# Patient Record
Sex: Male | Born: 1985 | Race: Black or African American | Hispanic: No | Marital: Single | Smoking: Current every day smoker
Health system: Southern US, Community
[De-identification: ages and names within clinical notes are randomized; demographics above are authoritative.]

## PROBLEM LIST (undated history)

## (undated) DIAGNOSIS — J45909 Unspecified asthma, uncomplicated: Secondary | ICD-10-CM

## (undated) DIAGNOSIS — M797 Fibromyalgia: Secondary | ICD-10-CM

## (undated) DIAGNOSIS — D219 Benign neoplasm of connective and other soft tissue, unspecified: Secondary | ICD-10-CM

## (undated) HISTORY — PX: EYE SURGERY: SHX253

---
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
## 2006-02-05 ENCOUNTER — Emergency Department (HOSPITAL_COMMUNITY): Admission: EM | Admit: 2006-02-05 | Discharge: 2006-02-05 | Payer: Self-pay | Admitting: Emergency Medicine

## 2006-03-16 ENCOUNTER — Emergency Department (HOSPITAL_COMMUNITY): Admission: EM | Admit: 2006-03-16 | Discharge: 2006-03-17 | Payer: Self-pay | Admitting: Emergency Medicine

## 2006-09-03 ENCOUNTER — Emergency Department (HOSPITAL_COMMUNITY): Admission: EM | Admit: 2006-09-03 | Discharge: 2006-09-03 | Payer: Self-pay | Admitting: Emergency Medicine

## 2006-11-29 ENCOUNTER — Emergency Department (HOSPITAL_COMMUNITY): Admission: EM | Admit: 2006-11-29 | Discharge: 2006-11-29 | Payer: Self-pay | Admitting: Emergency Medicine

## 2006-12-01 ENCOUNTER — Ambulatory Visit: Payer: Self-pay | Admitting: Infectious Diseases

## 2006-12-01 ENCOUNTER — Inpatient Hospital Stay (HOSPITAL_COMMUNITY): Admission: EM | Admit: 2006-12-01 | Discharge: 2006-12-06 | Payer: Self-pay | Admitting: Emergency Medicine

## 2006-12-06 ENCOUNTER — Encounter (INDEPENDENT_AMBULATORY_CARE_PROVIDER_SITE_OTHER): Payer: Self-pay | Admitting: Internal Medicine

## 2006-12-06 DIAGNOSIS — M6282 Rhabdomyolysis: Secondary | ICD-10-CM

## 2006-12-08 ENCOUNTER — Inpatient Hospital Stay (HOSPITAL_COMMUNITY): Admission: EM | Admit: 2006-12-08 | Discharge: 2006-12-10 | Payer: Self-pay | Admitting: Emergency Medicine

## 2006-12-13 ENCOUNTER — Encounter (INDEPENDENT_AMBULATORY_CARE_PROVIDER_SITE_OTHER): Payer: Self-pay | Admitting: Internal Medicine

## 2006-12-16 ENCOUNTER — Ambulatory Visit: Payer: Self-pay | Admitting: Internal Medicine

## 2006-12-16 ENCOUNTER — Encounter (INDEPENDENT_AMBULATORY_CARE_PROVIDER_SITE_OTHER): Payer: Self-pay | Admitting: Internal Medicine

## 2006-12-17 LAB — CONVERTED CEMR LAB
BUN: 10 mg/dL (ref 6–23)
CO2: 27 meq/L (ref 19–32)
Chloride: 109 meq/L (ref 96–112)
Creatinine, Ser: 0.74 mg/dL (ref 0.40–1.50)
Glucose, Bld: 91 mg/dL (ref 70–99)
Sodium: 144 meq/L (ref 135–145)
Total CK: 1880 units/L — ABNORMAL HIGH (ref 7–232)

## 2007-01-15 ENCOUNTER — Emergency Department (HOSPITAL_COMMUNITY): Admission: EM | Admit: 2007-01-15 | Discharge: 2007-01-15 | Payer: Self-pay | Admitting: Emergency Medicine

## 2007-04-01 ENCOUNTER — Emergency Department (HOSPITAL_COMMUNITY): Admission: EM | Admit: 2007-04-01 | Discharge: 2007-04-01 | Payer: Self-pay | Admitting: Family Medicine

## 2007-04-18 ENCOUNTER — Emergency Department (HOSPITAL_COMMUNITY): Admission: EM | Admit: 2007-04-18 | Discharge: 2007-04-18 | Payer: Self-pay | Admitting: Emergency Medicine

## 2007-04-20 ENCOUNTER — Emergency Department (HOSPITAL_COMMUNITY): Admission: EM | Admit: 2007-04-20 | Discharge: 2007-04-20 | Payer: Self-pay | Admitting: Family Medicine

## 2007-12-02 ENCOUNTER — Inpatient Hospital Stay (HOSPITAL_COMMUNITY): Admission: EM | Admit: 2007-12-02 | Discharge: 2007-12-10 | Payer: Self-pay | Admitting: Emergency Medicine

## 2007-12-12 ENCOUNTER — Emergency Department (HOSPITAL_COMMUNITY): Admission: EM | Admit: 2007-12-12 | Discharge: 2007-12-12 | Payer: Self-pay | Admitting: Emergency Medicine

## 2007-12-13 ENCOUNTER — Other Ambulatory Visit: Payer: Self-pay | Admitting: Emergency Medicine

## 2007-12-13 ENCOUNTER — Emergency Department (HOSPITAL_COMMUNITY): Admission: EM | Admit: 2007-12-13 | Discharge: 2007-12-13 | Payer: Self-pay | Admitting: Emergency Medicine

## 2007-12-14 ENCOUNTER — Inpatient Hospital Stay (HOSPITAL_COMMUNITY): Admission: EM | Admit: 2007-12-14 | Discharge: 2007-12-14 | Payer: Self-pay | Admitting: Urology

## 2007-12-16 ENCOUNTER — Emergency Department (HOSPITAL_COMMUNITY): Admission: EM | Admit: 2007-12-16 | Discharge: 2007-12-17 | Payer: Self-pay | Admitting: Emergency Medicine

## 2007-12-16 ENCOUNTER — Emergency Department (HOSPITAL_COMMUNITY): Admission: EM | Admit: 2007-12-16 | Discharge: 2007-12-16 | Payer: Self-pay | Admitting: General Surgery

## 2008-12-10 ENCOUNTER — Emergency Department (HOSPITAL_COMMUNITY): Admission: EM | Admit: 2008-12-10 | Discharge: 2008-12-10 | Payer: Self-pay | Admitting: Emergency Medicine

## 2009-01-28 ENCOUNTER — Observation Stay (HOSPITAL_COMMUNITY): Admission: EM | Admit: 2009-01-28 | Discharge: 2009-02-01 | Payer: Self-pay | Admitting: Emergency Medicine

## 2009-01-30 ENCOUNTER — Ambulatory Visit: Payer: Self-pay | Admitting: Infectious Diseases

## 2009-01-30 ENCOUNTER — Encounter (INDEPENDENT_AMBULATORY_CARE_PROVIDER_SITE_OTHER): Payer: Self-pay | Admitting: Oral Surgery

## 2009-08-09 ENCOUNTER — Emergency Department (HOSPITAL_COMMUNITY): Admission: EM | Admit: 2009-08-09 | Discharge: 2009-08-10 | Payer: Self-pay | Admitting: Emergency Medicine

## 2009-08-21 ENCOUNTER — Emergency Department (HOSPITAL_COMMUNITY): Admission: EM | Admit: 2009-08-21 | Discharge: 2009-08-21 | Payer: Self-pay | Admitting: Emergency Medicine

## 2009-11-23 ENCOUNTER — Inpatient Hospital Stay (HOSPITAL_COMMUNITY): Admission: EM | Admit: 2009-11-23 | Discharge: 2009-11-27 | Payer: Self-pay | Admitting: Emergency Medicine

## 2009-12-09 ENCOUNTER — Ambulatory Visit: Payer: Self-pay | Admitting: Family Medicine

## 2009-12-09 ENCOUNTER — Encounter (INDEPENDENT_AMBULATORY_CARE_PROVIDER_SITE_OTHER): Payer: Self-pay | Admitting: Internal Medicine

## 2010-04-22 ENCOUNTER — Emergency Department (HOSPITAL_COMMUNITY): Admission: EM | Admit: 2010-04-22 | Discharge: 2010-04-23 | Payer: Self-pay | Admitting: Emergency Medicine

## 2010-04-23 ENCOUNTER — Ambulatory Visit: Payer: Self-pay | Admitting: Psychiatry

## 2010-04-23 ENCOUNTER — Inpatient Hospital Stay (HOSPITAL_COMMUNITY): Admission: AD | Admit: 2010-04-23 | Discharge: 2010-04-24 | Payer: Self-pay | Admitting: Psychiatry

## 2010-06-28 ENCOUNTER — Emergency Department (HOSPITAL_COMMUNITY): Admission: EM | Admit: 2010-06-28 | Discharge: 2010-06-28 | Payer: Self-pay | Admitting: Emergency Medicine

## 2010-09-04 ENCOUNTER — Emergency Department (HOSPITAL_COMMUNITY): Admission: EM | Admit: 2010-09-04 | Discharge: 2009-11-14 | Payer: Self-pay | Admitting: Emergency Medicine

## 2010-11-06 ENCOUNTER — Inpatient Hospital Stay (INDEPENDENT_AMBULATORY_CARE_PROVIDER_SITE_OTHER)
Admission: RE | Admit: 2010-11-06 | Discharge: 2010-11-06 | Disposition: A | Discharge status: Home / Self Care | Payer: Self-pay | Source: Ambulatory Visit | Attending: Family Medicine | Admitting: Family Medicine

## 2010-11-06 DIAGNOSIS — M6740 Ganglion, unspecified site: Secondary | ICD-10-CM

## 2010-12-11 LAB — URINALYSIS, ROUTINE W REFLEX MICROSCOPIC
Glucose, UA: NEGATIVE mg/dL
Hgb urine dipstick: NEGATIVE
Specific Gravity, Urine: 1.024 (ref 1.005–1.030)
pH: 5 (ref 5.0–8.0)

## 2010-12-11 LAB — URINE MICROSCOPIC-ADD ON

## 2010-12-13 LAB — DIFFERENTIAL
Basophils Relative: 1 % (ref 0–1)
Eosinophils Absolute: 0.1 10*3/uL (ref 0.0–0.7)
Lymphocytes Relative: 51 % — ABNORMAL HIGH (ref 12–46)
Lymphs Abs: 1.9 10*3/uL (ref 0.7–4.0)
Monocytes Relative: 9 % (ref 3–12)

## 2010-12-13 LAB — CBC
MCV: 84.5 fL (ref 78.0–100.0)
Platelets: 197 10*3/uL (ref 150–400)
RDW: 13.6 % (ref 11.5–15.5)
WBC: 3.8 10*3/uL — ABNORMAL LOW (ref 4.0–10.5)

## 2010-12-13 LAB — ETHANOL: Alcohol, Ethyl (B): 5 mg/dL (ref 0–10)

## 2010-12-13 LAB — COMPREHENSIVE METABOLIC PANEL
Albumin: 3.9 g/dL (ref 3.5–5.2)
Alkaline Phosphatase: 68 U/L (ref 39–117)
BUN: 10 mg/dL (ref 6–23)
Calcium: 9.3 mg/dL (ref 8.4–10.5)
Creatinine, Ser: 0.87 mg/dL (ref 0.4–1.5)
GFR calc Af Amer: >60 mL/min (ref >60)
GFR calc non Af Amer: >60 mL/min (ref >60)
Glucose, Bld: 91 mg/dL (ref 70–99)
Potassium: 3.9 mEq/L (ref 3.5–5.1)

## 2010-12-13 LAB — RAPID URINE DRUG SCREEN, HOSP PERFORMED
Amphetamines: NOT DETECTED
Barbiturates: NOT DETECTED
Opiates: NOT DETECTED
Tetrahydrocannabinol: POSITIVE — AB

## 2010-12-13 LAB — CK: Total CK: 98 U/L (ref 7–232)

## 2010-12-17 LAB — DIFFERENTIAL
Basophils Relative: 1 % (ref 0–1)
Eosinophils Absolute: 0 10*3/uL (ref 0.0–0.7)
Lymphs Abs: 1.4 10*3/uL (ref 0.7–4.0)
Monocytes Absolute: 0.7 10*3/uL (ref 0.1–1.0)
Neutro Abs: 5.6 10*3/uL (ref 1.7–7.7)

## 2010-12-17 LAB — COMPREHENSIVE METABOLIC PANEL
ALT: 64 U/L — ABNORMAL HIGH (ref 0–53)
ALT: 69 U/L — ABNORMAL HIGH (ref 0–53)
AST: 226 U/L — ABNORMAL HIGH (ref 0–37)
AST: 256 U/L — ABNORMAL HIGH (ref 0–37)
Albumin: 2.8 g/dL — ABNORMAL LOW (ref 3.5–5.2)
Albumin: 2.9 g/dL — ABNORMAL LOW (ref 3.5–5.2)
Alkaline Phosphatase: 54 U/L (ref 39–117)
Alkaline Phosphatase: 56 U/L (ref 39–117)
BUN: 5 mg/dL — ABNORMAL LOW (ref 6–23)
BUN: 5 mg/dL — ABNORMAL LOW (ref 6–23)
CO2: 27 mEq/L (ref 19–32)
Calcium: 7.8 mg/dL — ABNORMAL LOW (ref 8.4–10.5)
Calcium: 8.6 mg/dL (ref 8.4–10.5)
Chloride: 107 mEq/L (ref 96–112)
Chloride: 108 mEq/L (ref 96–112)
Creatinine, Ser: 0.66 mg/dL (ref 0.4–1.5)
GFR calc Af Amer: >60 mL/min (ref >60)
GFR calc Af Amer: >60 mL/min (ref >60)
GFR calc non Af Amer: >60 mL/min (ref >60)
Glucose, Bld: 87 mg/dL (ref 70–99)
Glucose, Bld: 88 mg/dL (ref 70–99)
Potassium: 4 mEq/L (ref 3.5–5.1)
Potassium: 4 mEq/L (ref 3.5–5.1)
Sodium: 138 mEq/L (ref 135–145)
Sodium: 138 mEq/L (ref 135–145)
Total Bilirubin: 0.3 mg/dL (ref 0.3–1.2)
Total Bilirubin: <0.1 mg/dL — ABNORMAL LOW (ref 0.3–1.2)
Total Protein: 5.3 g/dL — ABNORMAL LOW (ref 6.0–8.3)
Total Protein: 5.5 g/dL — ABNORMAL LOW (ref 6.0–8.3)

## 2010-12-17 LAB — BASIC METABOLIC PANEL
Creatinine, Ser: 0.77 mg/dL (ref 0.4–1.5)
GFR calc non Af Amer: >60 mL/min (ref >60)
Glucose, Bld: 103 mg/dL — ABNORMAL HIGH (ref 70–99)
Potassium: 3.9 mEq/L (ref 3.5–5.1)
Sodium: 135 mEq/L (ref 135–145)

## 2010-12-17 LAB — CBC
HCT: 46.7 % (ref 39.0–52.0)
Hemoglobin: 15.3 g/dL (ref 13.0–17.0)
MCV: 84.1 fL (ref 78.0–100.0)
Platelets: 395 10*3/uL (ref 150–400)
WBC: 7.8 10*3/uL (ref 4.0–10.5)

## 2010-12-17 LAB — URINALYSIS, ROUTINE W REFLEX MICROSCOPIC
Ketones, ur: NEGATIVE mg/dL
pH: 6 (ref 5.0–8.0)

## 2010-12-17 LAB — URINE MICROSCOPIC-ADD ON

## 2010-12-17 LAB — CK: Total CK: >50000 U/L — ABNORMAL HIGH (ref 7–232)

## 2010-12-17 LAB — RAPID URINE DRUG SCREEN, HOSP PERFORMED
Amphetamines: NOT DETECTED
Opiates: POSITIVE — AB
Tetrahydrocannabinol: POSITIVE — AB

## 2010-12-17 LAB — SEDIMENTATION RATE: Sed Rate: 24 mm/hr — ABNORMAL HIGH (ref 0–16)

## 2010-12-17 LAB — HIV ANTIBODY (ROUTINE TESTING W REFLEX): HIV: NONREACTIVE

## 2010-12-17 LAB — ANA: Anti Nuclear Antibody(ANA): NEGATIVE

## 2010-12-17 LAB — HIGH SENSITIVITY CRP: CRP, High Sensitivity: 26.6 mg/L — ABNORMAL HIGH

## 2010-12-21 LAB — BASIC METABOLIC PANEL
BUN: 3 mg/dL — ABNORMAL LOW (ref 6–23)
BUN: 5 mg/dL — ABNORMAL LOW (ref 6–23)
CO2: 27 mEq/L (ref 19–32)
Calcium: 8.9 mg/dL (ref 8.4–10.5)
GFR calc non Af Amer: >60 mL/min (ref >60)
Glucose, Bld: 84 mg/dL (ref 70–99)
Glucose, Bld: 85 mg/dL (ref 70–99)
Potassium: 3.7 mEq/L (ref 3.5–5.1)
Sodium: 138 mEq/L (ref 135–145)
Sodium: 140 mEq/L (ref 135–145)

## 2010-12-21 LAB — CBC
HCT: 36.1 % — ABNORMAL LOW (ref 39.0–52.0)
Hemoglobin: 11.9 g/dL — ABNORMAL LOW (ref 13.0–17.0)
MCHC: 32.9 g/dL (ref 30.0–36.0)
MCV: 85.5 fL (ref 78.0–100.0)
Platelets: 257 10*3/uL (ref 150–400)
RDW: 13.1 % (ref 11.5–15.5)

## 2010-12-21 LAB — CK: Total CK: 34259 U/L — ABNORMAL HIGH (ref 7–232)

## 2010-12-31 LAB — URINE MICROSCOPIC-ADD ON

## 2010-12-31 LAB — URINALYSIS, ROUTINE W REFLEX MICROSCOPIC
Glucose, UA: NEGATIVE mg/dL
Hgb urine dipstick: NEGATIVE
Specific Gravity, Urine: 1.027 (ref 1.005–1.030)
Urobilinogen, UA: 1 mg/dL (ref 0.0–1.0)
pH: 6.5 (ref 5.0–8.0)

## 2010-12-31 LAB — POCT I-STAT, CHEM 8
Calcium, Ion: 1.19 mmol/L (ref 1.12–1.32)
Chloride: 104 mEq/L (ref 96–112)
HCT: 46 % (ref 39.0–52.0)
Sodium: 136 mEq/L (ref 135–145)
TCO2: 21 mmol/L (ref 0–100)

## 2010-12-31 LAB — RAPID URINE DRUG SCREEN, HOSP PERFORMED
Amphetamines: NOT DETECTED
Barbiturates: NOT DETECTED

## 2010-12-31 LAB — CK TOTAL AND CKMB (NOT AT ARMC)
CK, MB: 0.4 ng/mL (ref 0.3–4.0)
Relative Index: 0.3 (ref 0.0–2.5)

## 2011-01-06 LAB — COMPREHENSIVE METABOLIC PANEL
ALT: 11 U/L (ref 0–53)
AST: 18 U/L (ref 0–37)
AST: 23 U/L (ref 0–37)
Albumin: 4.4 g/dL (ref 3.5–5.2)
Alkaline Phosphatase: 106 U/L (ref 39–117)
Alkaline Phosphatase: 76 U/L (ref 39–117)
CO2: 29 mEq/L (ref 19–32)
Calcium: 9.2 mg/dL (ref 8.4–10.5)
Chloride: 100 mEq/L (ref 96–112)
Chloride: 102 mEq/L (ref 96–112)
GFR calc Af Amer: >60 mL/min (ref >60)
GFR calc Af Amer: >60 mL/min (ref >60)
GFR calc non Af Amer: >60 mL/min (ref >60)
Glucose, Bld: 87 mg/dL (ref 70–99)
Potassium: 3.8 mEq/L (ref 3.5–5.1)
Potassium: 4.1 mEq/L (ref 3.5–5.1)
Sodium: 138 mEq/L (ref 135–145)
Total Bilirubin: 1.1 mg/dL (ref 0.3–1.2)

## 2011-01-06 LAB — CBC
HCT: 46.4 % (ref 39.0–52.0)
Hemoglobin: 13.7 g/dL (ref 13.0–17.0)
MCHC: 33.7 g/dL (ref 30.0–36.0)
MCHC: 33.8 g/dL (ref 30.0–36.0)
MCV: 85.8 fL (ref 78.0–100.0)
Platelets: 149 10*3/uL — ABNORMAL LOW (ref 150–400)
Platelets: 173 10*3/uL (ref 150–400)
RBC: 4.76 MIL/uL (ref 4.22–5.81)
WBC: 8 10*3/uL (ref 4.0–10.5)
WBC: 8 10*3/uL (ref 4.0–10.5)
WBC: 9 10*3/uL (ref 4.0–10.5)

## 2011-01-06 LAB — BASIC METABOLIC PANEL
BUN: 3 mg/dL — ABNORMAL LOW (ref 6–23)
Calcium: 9 mg/dL (ref 8.4–10.5)
Chloride: 101 mEq/L (ref 96–112)
Creatinine, Ser: 0.97 mg/dL (ref 0.4–1.5)

## 2011-01-06 LAB — SEDIMENTATION RATE: Sed Rate: 2 mm/hr (ref 0–16)

## 2011-01-06 LAB — DIFFERENTIAL
Basophils Absolute: 0 10*3/uL (ref 0.0–0.1)
Basophils Relative: 0 % (ref 0–1)
Eosinophils Absolute: 0 10*3/uL (ref 0.0–0.7)
Eosinophils Relative: 0 % (ref 0–5)
Monocytes Absolute: 0.8 10*3/uL (ref 0.1–1.0)
Monocytes Relative: 9 % (ref 3–12)
Neutro Abs: 6.4 10*3/uL (ref 1.7–7.7)

## 2011-01-06 LAB — CULTURE, BLOOD (ROUTINE X 2): Culture: NO GROWTH

## 2011-01-06 LAB — ANAEROBIC CULTURE

## 2011-01-06 LAB — CULTURE, ROUTINE-ABSCESS

## 2011-01-06 LAB — C-REACTIVE PROTEIN: CRP: 9.7 mg/dL — ABNORMAL HIGH (ref <0.6)

## 2011-01-08 LAB — URINALYSIS, ROUTINE W REFLEX MICROSCOPIC
Bilirubin Urine: NEGATIVE
Glucose, UA: NEGATIVE mg/dL
Hgb urine dipstick: NEGATIVE
Ketones, ur: NEGATIVE mg/dL
Protein, ur: NEGATIVE mg/dL

## 2011-01-08 LAB — CBC
MCHC: 33.2 g/dL (ref 30.0–36.0)
RBC: 4.84 MIL/uL (ref 4.22–5.81)
WBC: 4.6 10*3/uL (ref 4.0–10.5)

## 2011-01-08 LAB — CK: Total CK: 372 U/L — ABNORMAL HIGH (ref 7–232)

## 2011-01-08 LAB — DIFFERENTIAL
Eosinophils Absolute: 0.1 10*3/uL (ref 0.0–0.7)
Lymphocytes Relative: 46 % (ref 12–46)
Lymphs Abs: 2.1 10*3/uL (ref 0.7–4.0)
Neutro Abs: 1.9 10*3/uL (ref 1.7–7.7)
Neutrophils Relative %: 42 % — ABNORMAL LOW (ref 43–77)

## 2011-01-08 LAB — POCT I-STAT, CHEM 8
Creatinine, Ser: 1.3 mg/dL (ref 0.4–1.5)
Hemoglobin: 15.3 g/dL (ref 13.0–17.0)
Potassium: 3.6 mEq/L (ref 3.5–5.1)
Sodium: 141 mEq/L (ref 135–145)

## 2011-01-08 LAB — RAPID URINE DRUG SCREEN, HOSP PERFORMED
Amphetamines: NOT DETECTED
Benzodiazepines: NOT DETECTED
Tetrahydrocannabinol: POSITIVE — AB

## 2011-02-10 ENCOUNTER — Emergency Department (HOSPITAL_COMMUNITY)
Admission: EM | Admit: 2011-02-10 | Discharge: 2011-02-10 | Disposition: A | Discharge status: Home / Self Care | Payer: Medicaid Other | Attending: Emergency Medicine | Admitting: Emergency Medicine

## 2011-02-10 DIAGNOSIS — K089 Disorder of teeth and supporting structures, unspecified: Secondary | ICD-10-CM | POA: Insufficient documentation

## 2011-02-10 DIAGNOSIS — K029 Dental caries, unspecified: Secondary | ICD-10-CM | POA: Insufficient documentation

## 2011-02-10 DIAGNOSIS — K047 Periapical abscess without sinus: Secondary | ICD-10-CM | POA: Insufficient documentation

## 2011-02-10 DIAGNOSIS — F17200 Nicotine dependence, unspecified, uncomplicated: Secondary | ICD-10-CM | POA: Insufficient documentation

## 2011-02-10 NOTE — H&P (Signed)
NAMEMINARD, MILLIRONS NO.:  [id number]   MEDICAL RECORD NO.:  [id number]          PATIENT TYPE:  EMS   LOCATION:  MINO                         FACILITY:  [facility]   PHYSICIAN:  Lonia Blood, M.D.DATE OF BIRTH:  17-Sep-1986   DATE OF ADMISSION:  12/02/2007  DATE OF DISCHARGE:                              HISTORY & PHYSICAL   PRIMARY CARE PHYSICIAN:  Unassigned.   CHIEF COMPLAINT:  Severe body aches/pain.   PRESENT ILLNESS:  Kirk Strong is a 25 year old gentleman with a  history of rhabdomyolysis x2 in March 2008.  He also has an ongoing  history of polysubstance abuse.  He presents the hospital with an  approximate 5-day history of severe generalized body aches.  Aches began  in his legs around his calves, extended up into his quadriceps area and  then proceeded to progress up his torso into his arms.  He states this  is consistent with his prior episode of rhabdomyolysis.  He does state  that he has had a cold so for approximately 1 month now which has been  defined by a severe cough, congestion and generalized upper respiratory  symptoms.  At this, part of his complaint complex does appear to be  improving.  He feels weak in general and has had difficulty ambulating  because of this weakness.  There is been no hematemesis.  There has been  no nausea, vomiting.  There has been no hematuria.  There has  been  abdominal pain.  There is been no hematochezia or melena.  There has  been no fever.  And the patient felt that the symptoms were worsening.  He presented to the emergency room for evaluation today.   REVIEW OF SYSTEMS:  The patient also complains of a small ulcerated area  on the right lateral aspect of the penis just inferior to the glans  which has been present for approximately 1 week.  He states that this  appears to be a rough area.  This is presently painful.  There is been  no bleeding or purulent discharge from this lesion as per his  history.  There is been no actual blistering, but simple ulceration.  Review of  systems otherwise is completely unremarkable.   PAST MEDICAL HISTORY:  1. History of rhabdomyolysis in March 2008 with rapid recurrence later      in March 2008.  2. Polysubstance abuse with urine drug screen positive for cocaine and      marijuana.  3. Tobacco abuse, one half pack per day.  4. Childhood asthma.   OUTPATIENT MEDICATIONS:  None.   ALLERGIES:  There is no known drug allergies.   FAMILY HISTORY:  The patient's mother is alive and healthy.  The  patient's father is alive and healthy.   SOCIAL HISTORY:  The patient is single.  He lives in [place].  He has  one healthy daughter.  He occasionally partakes of  alcohol.  He is  currently unemployed.   DATA REVIEW:  A CBC is unremarkable.  BMET is remarkable for low  potassium at 2.8, but is otherwise unremarkable.  AST is elevated 89,  but at hepatic enzymes are otherwise unremarkable.  Urinalysis reveals  15 ketones, 100 protein, large hemoglobin, 7-10 white blood cells, 0-3  red blood cells.  CK total was 30,966.  Urine drug screen is positive  for opiates, cocaine and THC.   PHYSICAL EXAMINATION:  VITAL SIGNS:  Temperature 98.4, blood pressure  100/67, heart rate 77, respiratory 20, oxygen saturation 98% on room  air.  GENERAL:  Thin, young gentleman in no acute respiratory distress.  HEENT:  Normocephalic, atraumatic.  There is some with outward rotation  of the right eye with the eyes not being symmetric.  Pupils are equal  and responsive.  NECK:  No JVD.  No lymphadenopathy or thyromegaly.  LUNGS:  Clear to auscultation bilaterally without wheezes or rhonchi.  CARDIOVASCULAR:  Tachycardic but regular without gallop or rub.  Normal  S1 and S2.  ABDOMEN:  Thin, nondistended, nontender.  Soft.  Bowel sounds present.  No hepatosplenomegaly, no rebound or ascites.  EXTREMITIES:  No  significant cyanosis, clubbing, edema bilateral  lower extremities.  No  unilateral edema or tenderness but there is diffuse tenderness to  palpation of the extremities upper and lower.  GENITOURINARY:  The patient is a normal uncircumcised male with normal  external genitalia with exception of an approximately 4 mm x 2 mm  ulcerated tight lesion in the location described above.  There is no  erythema or purulent discharge at this region.  There is no discharge  from the urethra.   IMPRESSION AND PLAN:  1. Severe rhabdomyolysis.  This is most likely secondary to ongoing      cocaine abuse.  We will treat the patient with aggressive IV fluid      resuscitation.  Fortunately at the present time the patient's renal      function stable.  We will follow his renal function very closely      with ongoing hydration.  Given the patient's hypokalemia, I do not      feel that administration of bicarbonate fluid, which is poorly      supported in the literature anyway, it is wise given the fact that      this will simply worsen his severe hypokalemia.  2. Hypokalemia.  The patient is suffering with significant      hypokalemia.  This likely secondary to severely decreased p.o.      intake over  last week.  I will rub replete the patient with p.o.      potassium as well as IV fluid separate from his resuscitation fluid      and we will follow his electrolytes very closely.  3. Possible herpes simplex virus 2.  Will send PCR for HSV and we will      counsel the patient accordingly based upon the results.  4. Polysubstance abuse.  I have counseled the patient as to the direct      link between cocaine abuse and rhabdomyolysis.  The patient himself      denies use of cocaine for greater than 2 months.  I have again      explained to the patient, however, that if he continue to use      cocaine, this rhabdo is very likely to recur on a regular basis.  5. History of tobacco abuse.  I have advised the patient of the      multiple deleterious effects  of ongoing tobacco abuse.  Will      provide  him with a nicotine patch as necessary.  Tobacco cessation      consultation will be requested.      Lonia Blood, M.D.  Electronically Signed     JTM/MEDQ  D:  12/02/2007  T:  12/02/2007  Job:  [id number]

## 2011-02-10 NOTE — Consult Note (Signed)
[name], Strong NO.:  [id number]   MEDICAL RECORD NO.:  [id number]          PATIENT TYPE:  INP   LOCATION:  1413                         FACILITY:  [facility]   PHYSICIAN:  Jamison Neighbor, M.D.  DATE OF BIRTH:  1985/11/19   DATE OF CONSULTATION:  12/14/2007  DATE OF DISCHARGE:  12/14/2007                                 CONSULTATION   CONSULTATIONS:  Jamison Neighbor, M.D.   REASON FOR CONSULTATION:  Gross hematuria.   HISTORY:  This 25 year old male was admitted to the hospital with  rhabdomyolysis approximately 2 weeks ago.  The patient had what sounds  like a traumatic Foley catheter insertion and had some problems with  pain and gross hematuria.  He has had some problems with bleeding per  urethra ever since.  The patient was seen in the emergency department  earlier today and the emergency room staff was advised to place a Foley  catheter thinking that this would help with any bleeding caused by  urethral trauma.  The plan will be leave the Foley catheter until the  urethra area healed.  The patient presented back to the emergency room  because of gross hematuria and clots coming out around the meatus and  increase in pain due to poor catheter drainage.   PAST MEDICAL HISTORY:  The patient's past medical history is remarkable  for this episode of rhabdomyolysis thought to be possibly due to drug  abuse.  The patient does have normal renal function.  His medical  history is otherwise unremarkable.   PHYSICAL EXAMINATION:  GENERAL:  He is a well-developed, well-nourished  male, somewhat uncomfortable from bleeding per urethra.  HEENT:  Normocephalic, atraumatic.  Cranial nerves II-XII grossly  intact.  NECK:  Supple.  No adenopathy or thyromegaly.  LUNGS:  Clear.  HEART:  Irregular rate and rhythm without murmurs, thrills, gallops,  rubs, heaves.  ABDOMEN:  Soft, nontender with no palpable mass, rebound or guarding.  GU:  Shows normal testicles  bilaterally.  The penis shows significant  clot coming out around the Foley catheter.  EXTREMITIES:  No cyanosis, clubbing, edema.  NEUROLOGIC:  Intact.   LABORATORY STUDIES:  Did not demonstrate anemia and renal function as  noted above was normal.   PLAN:  The operating room at [facility] is backed up, so the patient will be  transferred to [facility] for clot evacuation and fulguration of any  bleeding sites.      Jamison Neighbor, M.D.  Electronically Signed    RJE/MEDQ  D:  12/14/2007  T:  12/14/2007  Job:  [id number]

## 2011-02-10 NOTE — Discharge Summary (Signed)
Kirk Strong, MEADERS NO.:  [id number]   MEDICAL RECORD NO.:  [id number]          PATIENT TYPE:  OBV   LOCATION:  3013                         FACILITY:  [facility]   PHYSICIAN:  Ruthy Dick, MD    DATE OF BIRTH:  08-20-1986   DATE OF ADMISSION:  01/28/2009  DATE OF DISCHARGE:  02/01/2009                               DISCHARGE SUMMARY   REASON FOR ADMISSION:  1. Left facial cellulitis and dental abscess.  2. Tobacco abuse.  3. Polysubstance abuse.   CONSULTS DURING THIS ADMISSION:  Infectious Disease consult and Facial  Disease consult and Dental Surgery consult.   BRIEF HISTORY OF PRESENT ILLNESS AND HOSPITAL COURSE:  This is a 22-year  African American gentleman with the above mentioned problems, history  significant for asthma in childhood, rhabdomyolysis, pneumonia, and  cocaine abuse in the past who came in with left-sided facial pain and  was noted to have a dental abscess.  Admitted and treated with  antibiotics.  The patient also had a procedure with the dental surgeon  and during the surgery, a cyst was noted at tooth #9 and incision and  drainage was done.  There was also removal of teeth #9.  Treated with  vancomycin and Zosyn at first.  Subsequently vancomycin was discontinued  and Zosyn only was continued.  Cultures and sensitivity of the abscess  shows growth of Staphylococcus aureus, but there was no clarity whether  it was MRSA or MSSA.  In any case, the patient has remained mostly  afebrile and no new fever, no chest pain, no shortness of breath, no  abdominal pain, no nausea, no vomiting.  From the standpoint of dental  surgeon, the patient is stable enough to be discharged home.  We are  going to send him home on Augmentin 875 mg p.o. b.i.d. for 6 more days  to make a total of 10.  We are also sending him on Dilaudid 2 mg p.o.  q.6 h p.r.n. for pain, 20 tablets will be supplied.  The patient should  follow with his primary care physician,  Dr. Abbe Amsterdam, in 1-2 weeks and  also to follow up with dental surgeon by calling their phone number as  noted in the discharge instructions.  The phone number for Dr. Barbette Merino,  the oral surgeon, is [phone].   INSTRUCTION:  The patient to rinse his mouth with warm salt water 4  times a day.    Time used for discharge planning greater than 30 minutes.   PHYSICAL EXAMINATION:  VITAL SIGNS:  Today, his vitals were stable with  temperature of 98.1, pulse 53, respirations 18, blood pressure 111/59,  and saturating 97% on room air.  CHEST:  Clear to auscultation bilaterally.  ABDOMEN:  Soft and nontender.  EXTREMITIES:  No clubbing, cyanosis, or edema.  CARDIOVASCULAR:  First and second heart sounds only.  CENTRAL NERVOUS SYSTEM:  Nonfocal.   The patient's face is less swollen today and cellulitis is almost gone.  The patient has been counseled repeatedly to quit tobacco abuse and  polysubstance abuse.  He voices understanding.  Ruthy Dick, MD  Electronically Signed     GU/MEDQ  D:  02/01/2009  T:  02/02/2009  Job:  [id number]   cc:   Georgia Lopes, M.D.  [employer]

## 2011-02-10 NOTE — Op Note (Signed)
NAMEKLINE, BULTHUIS NO.:  [id number]   MEDICAL RECORD NO.:  [id number]          PATIENT TYPE:  INP   LOCATION:  1413                         FACILITY:  [facility]   PHYSICIAN:  Jamison Neighbor, M.D.  DATE OF BIRTH:  1986/06/13   DATE OF PROCEDURE:  12/14/2007  DATE OF DISCHARGE:                               OPERATIVE REPORT   PREOPERATIVE DIAGNOSIS:  Clot retention.   POSTOPERATIVE DIAGNOSES:  1. Clot retention.  2. Bleeding per urethra secondary to Foley catheter trauma.   PROCEDURE:  Cystoscopy, clot evacuation and fulguration of bleeding  site.   SURGEON:  Jamison Neighbor, M.D.   ANESTHESIA:  General.   COMPLICATIONS:  None.   DRAINS:  None.   BRIEF HISTORY:  This is a 25 year old male was admitted to the hospital  about two weeks ago for treatment of rhabdomyolysis thought to be  possibly due to drug abuse.  During that hospital stay a Foley catheter  was placed and the patient states that was very traumatic and he has had  bleeding ever since.  The patient was seen in the emergency room earlier  today and I was contacted by the ER personnel and told them to place a  Foley catheter to allow the urethra to have a chance to heal.  The  catheter was placed.  The patient was sent home with he began to have  worsening problems with bleeding.  He returned the emergency room later  in a day and had marked bleeding around the catheter with passage of  clot.  The patient could not have his surgery done at [facility] due to a long  back up and was brought to [facility] for the procedure.  He is now  undergo cystoscopy, clot evacuation and fulguration.  The patient  understands the risks and benefits of the procedure and knows he may  require a Foley catheter for a week or so.  Full informed consent was  obtained.   PROCEDURE:  After successful induction of general anesthesia, the  patient was placed in the dorsal lithotomy position, prepped with  Betadine and  draped in the usual sterile fashion.  Cystoscopy was  performed.  The urethra was visualized in its entirety.  Towards the  bulb there was traumatic area on the right-hand side where there was  bleeding from the urethra been traumatized.  The scope was advanced  beyond that point and into the bladder.  All the clots were irrigated  out of the bladder.  There was no active bleeding in the bladder.  There  was one small site in the bladder neck that was cauterized but for the  most part it appeared the bleeding was coming from the urethral area.  A  Bugbee electrode was used to cauterize the area until there was no  active bleeding.  It was felt that the patient would not have any  trouble urinating and that the catheter was only being done for the  purpose of tamponade and for that reason we decided we would leave the  catheter out since the bleeding site of  been successfully managed.  If it turns out that the patient does not urinate well, we will place a  Foley catheter of at least 20-French size and leave it in for a week or  two until the urine is clear.  If on the other hand the patient  urinates, no additional treatment will be required.      Jamison Neighbor, M.D.  Electronically Signed     RJE/MEDQ  D:  12/14/2007  T:  12/14/2007  Job:  [id number]

## 2011-02-10 NOTE — Op Note (Signed)
NAME:  Kirk Strong, Kirk Strong NO.:  [id number]   MEDICAL RECORD NO.:  [id number]          PATIENT TYPE:  OBV   LOCATION:  3013                         FACILITY:  [facility]   PHYSICIAN:  Georgia Lopes, M.D.  DATE OF BIRTH:  02/01/1986   DATE OF PROCEDURE:  01/30/2009  DATE OF DISCHARGE:                               OPERATIVE REPORT   PREOPERATIVE DIAGNOSIS:  Left maxillary cellulitis, abscess, cyst, tooth  #9.   POSTOPERATIVE DIAGNOSIS:  Left maxillary cellulitis, abscess, cyst,  tooth #9.   PROCEDURE:  Incision and drainage, left maxillary abscess, removal of  cyst of tooth #9.   SURGEON:  Georgia Lopes, MD   ANESTHESIA:  General.   PROCEDURE:  The patient was taken to the operating room, placed on the  table in supine position.  General anesthesia was administered  intravenously and an oral endotracheal tube was placed and secured.  The  patient was then draped for the surgical procedure.  Posterior pharynx  was suctioned and throat pack was placed.  Bite block was placed in the  right side of the mouth and 2% lidocaine 1:100,000 epinephrine was  infiltrated, approximately 6 mL was utilized of this solution.  A #15  blade was then used to make a full-thickness incision in the vestibule  overlying tooth #9 at the point of maximum fullness of the vestibular  mucosa.  Upon making the incision, no frank pus was encountered.  Upon  performing the initial incision and drainage, aerobic and anaerobic  cultures were taken at that time. The hemostat was used to dissect  inferiorly, superiorly, and laterally to open up any loculations that  may be present.  Then, a #15 blade was used to dissect down to the level  of the bone surrounding tooth #9.  Periosteal elevator was used to  reflect the periosteum and the bony crypt in the periapical aspect of  tooth #9 was identified and the cyst was removed from this area and  submitted for pathological examination.  After the cyst  was removed, the  area was further inspected to make sure there were no undrained areas.  A  small tear of the nasal mucosa, approximately 4mm, was noted,  due to  the earlier dissection with the hemostats, and this was closed with 4-0  chromic gut. Then the area was irrigated and a 1/4-inch Penrose drain  was placed and secured with a 3-0 silk; remainder of the incision was  closed with 4-0 chromic.  The oral cavity was then irrigated copiously  and the throat pack was removed.  The patient was awakened, taken to the  recovery room, breathing spontaneously in good condition.   ESTIMATED BLOOD LOSS:  Minimum.   COMPLICATIONS:  None.   SPECIMEN:  Cyst, tooth #9 cultures, aerobic, anaerobic cultures left  maxilla.   FINDINGS:  Cellulitis with no frank pus encountered.      Georgia Lopes, M.D.  Electronically Signed     SMJ/MEDQ  D:  01/30/2009  T:  01/31/2009  Job:  [id number]

## 2011-02-10 NOTE — H&P (Signed)
NAME:  Kirk Strong, Kirk Strong NO.:  [id number]   MEDICAL RECORD NO.:  [id number]          PATIENT TYPE:  OBV   LOCATION:  3013                         FACILITY:  [facility]   PHYSICIAN:  Beckey Rutter, MD  DATE OF BIRTH:  Sep 27, 1986   DATE OF ADMISSION:  01/28/2009  DATE OF DISCHARGE:                              HISTORY & PHYSICAL   PRIMARY CARE PHYSICIAN:  Kirk Strong is unassigned to [facility].   CHIEF COMPLAINT:  Left-sided face swelling and pain.   HISTORY OF PRESENT ILLNESS:  Kirk Strong is a 25 year old African  American gentleman with past medical history significant for asthma  during childhood, rhabdomyolysis, pneumonia, history of cocaine abuse,  plus ongoing tobacco abuse, and marijuana abuse.  He presented to the  emergency department complaining of left-sided pain.  He stated the  condition started about 5 days ago with mild pain and swelling and  progressively continued to the degree that the pain now is intolerable  almost an out of 10 and the swelling is obvious.  He recalls no trauma  or similar condition to the face.  He did not remember any sick contact.  He noticed small papule in the upper lip close to the entrance of the  left nostril that he could no longer see or recognize its place.  The  pain and tenderness is aggravated by palpation or when he is lying on  the left side of his face.  The pain in nature is dull 10 out of 10 with  no radiation.   PAST MEDICAL HISTORY:  1. Asthma as childhood.  2. History of rhabdomyolysis.  3. History of cocaine abuse.  4. Ongoing tobacco abuse and marijuana abuse.  5. History of left eye surgery for lazy eye.   SOCIAL HISTORY:  The patient denied alcohol abuse, but he admitted to  cannabis abuse.  His last cocaine abuse is 5 weeks ago and he stated  that he quit since he felt he owed it himself last time and he has sworn  not to go back to using cocaine.   MEDICATION ALLERGIES:  Not known  to have medication allergy.   MEDICATIONS:  None.   FAMILY HISTORY:  Noncontributory.  He denied significant family history  of diabetes, hypertension, or similar condition.   REVIEW OF SYSTEMS:  Other than HPI, 14-point review of systems is  unremarkable including fever or significant localized headache.   PHYSICAL EXAMINATION:  GENERAL:  He is comfortably sitting on the couch.  VITAL SIGNS:  His temperature is 98.4, blood pressure is 114 x 67, pulse  60, respiratory rate is 20.  HEAD:  No obvious trauma.  The patient has swelling in the left side of  the face including the upper limbs and the left cheek as well as the  left side of his nose.  This swelling is tender to touch.  There is no  obvious laceration or skin change.  NECK:  Supple.  No JVD.  HEART:  First and second heart sound audible.  No added sounds.  LUNGS:  Fair air entry bilaterally.  No adventitious sounds.  ABDOMEN:  Soft and nontender.  Bowel sounds audible.  EXTREMITIES:  No lower extremities edema.  NEUROLOGIC:  He is alert and oriented x3 sitting on the edge of the  couch.   LABORATORY DATA:  Sodium 136, potassium 4.1, chloride 100, bicarbonate  27, glucose 94, BUN 6, creatinine 0.83, calcium 9.6, alkaline  phosphatase is 1.1.  White blood count is 9.0, hemoglobin is 15.4,  hematocrit 46.4, platelet count is 173.  His neutrophil percentage is  72.   ASSESSMENT AND PLAN:  A 25 year old male with face cellulitis, rule out  methicillin-resistant Staphylococcus aureus and erysipelas.   PLAN:  1. The patient will be admitted for further assessment and management.  2. The patient will be given Percocet for pain control.  3. We will start the patient on vancomycin and Zosyn intravenously.  4. We will obtain CT scan to rule out abscess and to evaluate the      cavernous sinus.  5. We will obtain chest x-ray since the patient does not have any x-      ray in the system for completeness of the work considering  the fact      that the patient had severe bilateral pneumonia in the past.  6. For DVT prophylaxis, we will start Lovenox.  For GI prophylaxis, we      will consider Protonix.  7. The patient will have two blood cultures sent.  8. I counseled Kirk Strong in regarding to his substance abuse and his      tobacco abuse.  I will give him nicotine 21 mg while he is in the      hospital.  I also put an order for tobacco cessation counseling and      substance abuse counseling.      Beckey Rutter, MD  Electronically Signed     EME/MEDQ  D:  01/28/2009  T:  01/29/2009  Job:  [id number]

## 2011-02-13 NOTE — Discharge Summary (Signed)
NAMEKEYSHAUN, EXLEY NO.:  [id number]   MEDICAL RECORD NO.:  [id number]          PATIENT TYPE:  INP   LOCATION:  5503                         FACILITY:  [facility]   PHYSICIAN:  Wilson Singer, M.D.DATE OF BIRTH:  09-Jul-1986   DATE OF ADMISSION:  12/02/2007  DATE OF DISCHARGE:  12/10/2007                               DISCHARGE SUMMARY   FINAL DISCHARGE DIAGNOSIS:  Severe mass/massive cocaine-induced  rhabdomyolysis.   CONDITION ON DISCHARGE:  Stable.   MEDICATIONS ON DISCHARGE:  Percocet 5/325, 1-2 tablets every 4 hours as  needed.   HISTORY:  This 25 year old man who has a history of previous  rhabdomyolysis twice in March 2008, presented with aching of his legs  around his calf, extending up to his quadriceps area and then proceeded  to progress up to his torso and to his arms.  He does have a history of  ongoing polysubstance drug abuse including cocaine.  Please see initial  history and physical examination by Dr. Jetty Duhamel.   HOSPITAL PROGRESS:  The patient was admitted and started on a high rate  of intravenous fluids and eventually required bicarbonate also in the  drip.  This gradually improved his CPK levels, which were in the tens of  thousands.  He also required mannitol during his hospitalization.  He  did have some chest pain, which was sounding pleuritic in nature, and a  CT angio of chest was done, which was negative for pulmonary embolism.  On the day of discharge, he had much improved and had resolution of most  of his symptoms.  He was drinking fluids well by mouth.   PHYSICAL EXAMINATION:  Temperature 98, blood pressure 105/58, pulse 67,  and saturation 98% on room air.   LABORATORY DATA:  His blood work showed a sodium of 138, potassium 3.8,  bicarbonate 28, BUN 10, and creatinine 0.85.  His CPK was 39,014.  This  was a significant decrease from his very high levels that he obtained of  a CPK in the range of 406,902!   FURTHER  DISPOSITION:  The patient was able to be discharged home and has  been encouraged to take increasing oral fluids in the next few days.  Obviously, he has been cautioned and counseled about subsequent cocaine  use, which could lead again to the same problems with rhabdomyolysis.  Fortunately, his renal function remained intact.      Wilson Singer, M.D.  Electronically Signed     NCG/MEDQ  D:  01/09/2008  T:  01/10/2008  Job:  [id number]

## 2011-02-13 NOTE — Discharge Summary (Signed)
NAMERONELL, DUFFUS NO.:  [id number]   MEDICAL RECORD NO.:  [id number]          PATIENT TYPE:  INP   LOCATION:  3028                         FACILITY:  [facility]   PHYSICIAN:  Fransisco Hertz, M.D.  DATE OF BIRTH:  [birth year]   DATE OF ADMISSION:  12/01/2006  DATE OF DISCHARGE:  12/06/2006                               DISCHARGE SUMMARY   DISCHARGE DIAGNOSES:  1. Rhabdomyolysis likely secondary to post viral myositis.  2. Tobacco abuse.   DISCHARGE MEDICATIONS:  MSIR 50 mg 1-2 tablets p.o. q.4 hours p.r.n. for  pain.  We have dispensed a total of 60 tablets.   DISPOSITION AND FOLLOWUP:  Patient is to follow up at the [facility].  He is informed to drink plenty of water and he will  go to the outpatient clinic on March 13 at 11 o'clock in the morning for  lab work only and then he will see me, Dr. Ardyth Harps, on March 20 at 4  p.m.  Creatinine levels and CK levels should be monitored as well as for  resolution of rhabdomyolisis.   IMAGING STUDIES PERFORMED DURING THIS HOSPITALIZATION:  Include a chest  x-ray on November 29, 2006, that showed no evidence of acute cardiopulmonary  disease.   PHYSICAL EXAMINATION:  For full details please refer to the chart, but  in review Mr. Muska is a 25 year old African American man who has a  past medical history significant for tobacco and polysubstance abuse who  knowingly uses marijuana who presented with severe myalgias for the past  1 week especially in the neck, back and thighs.  He sought medical  attention 2 days ago but left before treatment was established.  He  states that he is very weak and cannot walk more than 2-3 steps before  he feels exhausted.  Of note, he states that approximately 2 weeks ago  he had an upper respiratory infection which resolved spontaneously.  Only used Tylenol Extra Strength for the pain.  VITAL SIGNS:  Upon admission were consistent with a temperature 96.5.  Blood  pressure 118/81.  Heart rate 97.  Respirations 22.  O2 sats were  100% on room air.   LABS UPON ADMISSION:  Showed a sodium of 134.  Potassium of 4.4.  Chloride 105.  Bicarb 26.  BUN 8.  Creatinine 1.1.  Glucose of 91.  Hemoglobin 17.3.  Hematocrit of 51.  His CK was originally 22,3000.  A  UA was cloudy but showed no evidence of nitrates, leukocytes esterase,  or WBCs.  His UDS was positive for THC.  His alcohol level was less than  5.   HOSPITAL COURSE:  1. Rhabdomyolysis.  Again the patient had no recent crush, injury or      trauma.  No prolonged exercise or increased physical activity.  He      denied fatigue and myalgia since childhood which makes an inherited      metabolic myopathy less likely.  We did exclude hypothyroidism with      a normal TSH.  We believe he might have myositis secondary to his  recent viral infection.  We thought that he might have had      influenza which we still believe is likely despite the fact his      nasal antigens were negative.  Roughly checked him for HIV to which      he was nonreactive and Epstein-Barr virus we showed a high titers      of IgG antibodies but low titers of IgM antibodies indicating a      past infection.  We aggressively hydrated him with normal saline to      prevent pigment-induced acute renal failure.  We followed his CK      levels with serial BMETs.  His creatinine decreased to normal      levels, he never  had high creatinine, and his CK upon discharge is      72000 and it keeps trending down.  We feel comfortable enough      discharging him home.  He will follow up in a few days in the      outpatient clinic to have a CK and low creatinine drawn, and then      he will see me in the outpatient setting in about 2 weeks from now.  2. Tobacco abuse.  We had him on a nicotine patch and had smoking      cessation consult while in the hospital.  Patient does not seem to      be very interested in smoking cessation.    VITALS ON DAY OF DISCHARGE:  Temperature 98.7.  Blood pressure 122/71.  Heart rate of 66.  Respirations 20.  O2 sats are 98% on room air.   LABS UPON DISCHARGE:  Sodium of 139.  Potassium 4.1.  Chloride 108.  Bicarb 27.  BUN 9.  Creatinine 0.7 and a glucose of 94.  Calcium of 9.3.  His CK was 72,670.      Peggye Pitt, M.D.  Electronically Signed      Fransisco Hertz, M.D.  Electronically Signed    EH/MEDQ  D:  12/06/2006  T:  12/06/2006  Job:  [id number]   cc:   Yvonne Kendall, M.D.

## 2011-02-13 NOTE — H&P (Signed)
NAMEPHILIPP, CALLEGARI NO.:  [id number]   MEDICAL RECORD NO.:  [id number]          PATIENT TYPE:  INP   LOCATION:  3002                         FACILITY:  [facility]   PHYSICIAN:  Beckey Rutter, MD  DATE OF BIRTH:  [birth year]   DATE OF ADMISSION:  12/07/2006  DATE OF DISCHARGE:                              HISTORY & PHYSICAL   CHIEF COMPLAINT:  Generalized body pains.   HISTORY OF PRESENT ILLNESS:  This is a 25 year old African American male  with no significant past medical history, came today complaining of  generalized aches and pain.  The patient was discharged this morning  from the hospital, admitted for a similar complaint.  At that time, he  was found to have elevated CPK.  The patient was admitted to the  hospital and evidently he received pain medication and fluids with  improvement.  He was discharged today to home, but when he went home he  felt the pain is too much and he checked himself in again to the  emergency room.   PAST MEDICAL HISTORY:  Childhood asthma.   FAMILY HISTORY:  Noncontributory.   SOCIAL HISTORY:  Lives with his family.  Denied smoking or drugs.  Occasional ethanol.   ALLERGIES:  NO KNOWN DRUG ALLERGIES.   MEDICATIONS:  He is not taking any medications.   REVIEW OF SYSTEMS:  Unremarkable.   PHYSICAL EXAMINATION:  GENERAL:  He is comfortable in bed, in no acute  distress.  VITAL SIGNS:  Temperature 97.7, pulse is 92, respiratory rate 18, blood  pressure 140/80.  HEENT:  Head is atraumatic, normocephalic.  Eyes:  PERRL.  Mouth:  Moist.  No ulcer.  NECK:  No JVD, supple.  LUNGS:  Bilateral fair air entry.  CARDIOVASCULAR:  First and second heart sounds audible.  No added  sounds.  ABDOMEN:  Soft, mild tender on deep palpation.  Bowel sounds present.  GU:  No CVA tenderness.  EXTREMITIES:  No edema.  Pulses palpable.  SKIN:  No hyperpigmentation.  NEUROLOGICAL:  Alert and oriented x3.  Moves all of his extremities in  all direction.   LABORATORY DATA:  An x-ray was not done at this time.   White count was 8.4, hemoglobin 13, hematocrit 40, platelets 289.  Sodium 136, potassium 4.2, chloride 103, bicarb 91, BUN 14, creatinine  0.9.  CPK is 43,967, which is lower than the number he had before that,  which is 98,654 and the highest number ever on the E-chart is 183,474.   IMPRESSION:  This is a 25 year old male with a past medical history  significant for childhood asthma and recent hospitalization with  rhabdomyolysis and elevated CPK came again after discharge for body  pains.  The CPK number is not elevated than the before that and they all  are kind of trending down when you put them all together, which seems  like he does not have a new event of rhabdomyolysis at this time.  Because of the pain, the patient will be admitted, continued on fluids  for better pain control as well as further assessment and testing for  the reason behind this rhabdomyolysis, hence the etiology seems to be  not readily clear though he had some just done during the  hospitalization.   PLAN:  The patient will be admitted for further assessment and  management.  We will restart IV fluid normal saline at rate of 75 and  consider increasing or decreasing the rate depending on overall  condition.  We will check a CPK for monitoring.  we will check PMED  daily for evaluation of rhythm function.  We will check hepatitis C.  The patient will be given then medication, we will continue the patient  on Percocet p.o. continuous.  We will obtain the old records, hence  there is not much information right now on the E-chart regarding this  hospitalization.  We will consider Lovenox for DVT prophylaxis and  Protonix for GI prophylaxis.      Beckey Rutter, MD  Electronically Signed     EME/MEDQ  D:  12/08/2006  T:  12/09/2006  Job:  [id number]

## 2011-02-13 NOTE — Discharge Summary (Signed)
Kirk, Strong NO.:  [id number]   MEDICAL RECORD NO.:  [id number]          PATIENT TYPE:  INP   LOCATION:  3002                         FACILITY:  [facility]   PHYSICIAN:  Fransisco Hertz, M.D.  DATE OF BIRTH:  Jul 09, 1986   DATE OF ADMISSION:  12/07/2006  DATE OF DISCHARGE:  12/10/2006                               DISCHARGE SUMMARY   DISCHARGE DIAGNOSES:  1. Rhabdomyolysis, likely secondary to viral myositis.  2. Transaminitis, likely secondary to rhabdomyolysis.  3. Marijuana abuse.  4. History of alcohol abuse.  5. History of cocaine abuse.   DISCHARGE MEDICATIONS:  1. Percocet 5/325 1 to 2 tabs p.o. every 4 hours p.r.n. pain,      dispensed #40.   DISPOSITION AND FOLLOWUP:  At the time of discharge, the patient's  bilateral thigh pain was improved and well-controlled with Percocet.  He  was able to ambulate without assistance.  His renal function has  remained completely stable and his total creatine kinase is trending  downwards.  The patient was counseled on how to stay well-hydrated and  will return to the outpatient clinic for a basic metabolic panel and  total CK on Monday, March 17, at 11 a.m.  He is also scheduled for a  complete followup with Dr. Ardyth Harps on March 20th at 4 p.m.  At that  time, the results of his hemoglobin electrophoresis should be followed  up on, as this test is pending at the time of discharge.   PROCEDURES PERFORMED:  None.   CONSULTATIONS:  None.   BRIEF ADMISSION HISTORY AND PHYSICAL:  Kirk Strong is a 25 year old man  with a history of rhabdomyolysis thought to be due to viral myositis,  for which the patient was recently admitted.  In fact, he was discharged  2 days prior to this presentation.  However, he reports that since that  time, his pain was poorly-controlled and that he had persistent weakness  in his legs.  Although he initially reported that he had been taking  morphine sulfate 15-30 mg every 4 hours  as prescribed, he later revealed  that he did not have this prescription filled and was using Tylenol  intermittently for pain control without significant relief.  He stated  that he had remained well-hydrated as he was instructed to at the time  of discharge.  In the interim, he has not had any fevers, chills, or  night sweats.  He had no additional complaints, except that he has been  concerned about his illness and was still somewhat unclear about its  etiology when he was previously discharged.   PHYSICAL EXAMINATION:  VITAL SIGNS:  Temperature 97.9, blood pressure  102/60, pulse 58, respirations 16, oxygen saturation 98% on room air.  Weight 98.3 kg.  GENERAL:  The patient is a slender young man, seated comfortably in no  acute distress, watching television.  HEENT:  Pupils are equal, round, and reactive to light and  accommodation.  Extraocular eye movements are intact.  Oropharynx is  mildly erythematous without edema or exudates.  NECK:  Neck is supple without lymphadenopathy.  RESPIRATORY:  Lungs are clear to auscultation bilaterally with good air  movement.  CARDIOVASCULAR:  The patient has a regular rate and rhythm without  murmurs, rubs, or gallops.  ABDOMEN:  Positive bowel sounds are present.  The abdomen is soft with  diffuse tenderness to palpation of the abdominal muscles.  Voluntary  guarding is noted, but no rebound is present.  EXTREMITIES:  No clubbing, cyanosis, or edema is present.  LYMPHATIC:  No cervical or supraclavicular lymphadenopathy is noted.  MUSCULOSKELETAL:  The patient has mild to moderate diffuse muscle  tenderness of all major muscle groups, particularly his thighs.  NEUROLOGICAL:  The patient is alert and oriented x3.  He has 4+/5  strength, primarily limited by pain in his upper and lower extremities  bilaterally.  He has normal light touch sensation throughout.  His deep  tendon reflexes are 2+ and symmetric.  Cranial nerves II-XII are intact.   PSYCHIATRIC:  The patient appears upset, but has an appropriate affect.   LABORATORY DATA:  Admission labs:  White blood cell count 8.4, ANC 6.2,  hemoglobin 13.5, platelets 289.  Sodium 141, potassium 3.9, chloride  106, bicarbonate 24, BUN 15, creatinine 0.6 (stable since discharge on  March 10), glucose 85.  Total bilirubin 0.6, alkaline phosphatase 54,  AST 256, ALT 298, total protein 58, albumin 3.2, calcium 9.1.  Urinalysis notable for a specific gravity of 1.024, moderate blood, 0-2  white blood cells, 3-6 red blood cells, and 30 protein.  TSH 1.341.  Total CK 43,967 (previously 72,670 on March 10.)  Influenza A IgG  negative, IgM negative.  Influenza B IgG negative, IgM negative.  Hemoglobin A1c 5.4.   HOSPITAL COURSE:  1. Rhabdomyolysis:  Based on the physical exam and the lab findings at      the time of this admission, it appears that the patient's      rhabdomyolysis has actually improved since the previous discharge.      Most likely, his readmission was secondary to not having his      morphine sulfate prescription filled.  He did not have any evidence      of worsening rhabdomyolysis, based on physical exam and his total      CK.  Additionally, his renal function had remained completely      normal.  Nonetheless, he was admitted for pain control and IV      hydration.  His total creatine kinase has continued to trend down      and is 16,737 at the time of discharge.  His serum creatinine has      remained stable at 0.6.  The remainder of his electrolytes are also      normal.  His pain is reasonably well-controlled with p.o. Percocet.      During the previous hospitalization, the patient experienced mild      pruritus with Percocet, but states that this has not been a problem      during this hospitalization.  He will be discharged home with a      prescription for Percocet and was strongly urged to have this     prescription filled so that his pain is adequately  controlled.  He      will return to the clinic on the 17th of March for blood work,      including a basic metabolic panel and total creatine kinase.  If      these reveal any significant abnormalities, he will be readmitted  or evaluated further in the clinic.  Additionally, he is scheduled      to follow up with Dr. Ardyth Harps in the outpatient clinic, as noted      above, on the 20th of March.  2. Transaminitis:  On admission, the patient was found to have      considerably elevated AST and ALT.  Though he does not have a      history of liver disease, hepatitis serologies were obtained.  At      this time, his hepatitis C antibody and hepatitis B surface      antibody are both negative.  Hepatitis core antibody and hepatitis      A antibody are pending at this time.  However, he does not have any      other findings to suggest significant liver disease.  Most likely,      these elevated transaminases are secondary to significant muscle      breakdown from rhabdomyolysis.  They have trended down and are 169      and 178, respectively, at the time of discharge.  Followup of these      values to ensure that they have normalized when the patient's      rhabdomyolysis has resolved is recommended.  3. Polysubstance abuse:  The patient has a history of distant cocaine      abuse and continues to use marijuana intermittently.  He was      counseled strongly against using these substances, particularly      cocaine, as it can precipitate rhabdomyolysis.  Additionally, I      have spoken with the patient at length about using alcohol and      tobacco products.  Smoking cessation counseling was performed      during this hospitalization.  These issues should be readdressed      when the patient is seen for followup.   DISCHARGE LABORATORY VALUES:  White blood cell count 4.3, hemoglobin  11.6, platelets 233.  Sodium 138, potassium 3.8, chloride 108,  bicarbonate 26, BUN 7, creatinine 0.6,  glucose 92.  Total bilirubin 0.6,  alkaline phosphatase 53, AST 169, ALT 178, total protein 5.3, albumin  3.0, calcium 8.8, lipase 22, total CK 16,737.   DISCHARGE VITAL SIGNS:  Temperature 96.7, blood pressure 100/45, pulse  53, respirations 20, oxygen saturation 98% on room air.      Yvonne Kendall, M.D.  Electronically Signed      Fransisco Hertz, M.D.  Electronically Signed    CE/MEDQ  D:  12/10/2006  T:  12/11/2006  Job:  [id number]   cc:   Peggye Pitt, M.D.

## 2011-06-22 LAB — DIFFERENTIAL
Basophils Absolute: 0
Basophils Absolute: 0
Basophils Relative: 1
Eosinophils Absolute: 0
Eosinophils Absolute: 0
Eosinophils Relative: 0
Lymphocytes Relative: 27
Lymphocytes Relative: 35
Lymphs Abs: 1.6
Monocytes Absolute: 0.5
Monocytes Absolute: 0.4
Monocytes Relative: 11
Monocytes Relative: 8
Neutro Abs: 2.5
Neutrophils Relative %: 66
Neutrophils Relative %: 51

## 2011-06-22 LAB — CK
Total CK: 103613 — ABNORMAL HIGH
Total CK: 117967 — ABNORMAL HIGH
Total CK: 30701 — ABNORMAL HIGH
Total CK: 352187 — ABNORMAL HIGH
Total CK: 406902 — ABNORMAL HIGH
Total CK: >50000 — ABNORMAL HIGH
Total CK: >50000 — ABNORMAL HIGH

## 2011-06-22 LAB — COMPREHENSIVE METABOLIC PANEL
ALT: 125 — ABNORMAL HIGH
ALT: 20
AST: 369 — ABNORMAL HIGH
Albumin: 2.7 — ABNORMAL LOW
BUN: 4 — ABNORMAL LOW
CO2: 24
CO2: 31
Calcium: 8.8
Chloride: 102
Creatinine, Ser: 0.72
GFR calc Af Amer: >60
GFR calc non Af Amer: >60
GFR calc non Af Amer: >60
Glucose, Bld: 87
Potassium: 3.7
Sodium: 137
Sodium: 140
Total Bilirubin: 0.3
Total Protein: 7

## 2011-06-22 LAB — BLOOD GAS, ARTERIAL
Bicarbonate: 29.2 — ABNORMAL HIGH
O2 Saturation: 97.8
Patient temperature: 98.6
TCO2: 30.4
pH, Arterial: 7.475 — ABNORMAL HIGH

## 2011-06-22 LAB — SEDIMENTATION RATE: Sed Rate: 15

## 2011-06-22 LAB — BASIC METABOLIC PANEL
BUN: 10
BUN: 2 — ABNORMAL LOW
CO2: 29
CO2: 29
CO2: 32
CO2: 33 — ABNORMAL HIGH
CO2: 24
Calcium: 6.3 — CL
Calcium: 8.2 — ABNORMAL LOW
Calcium: 8.3 — ABNORMAL LOW
Calcium: 8.3 — ABNORMAL LOW
Calcium: 8.7
Calcium: 9.8
Calcium: 9.6
Chloride: 98
Creatinine, Ser: 0.57
Creatinine, Ser: 0.67
GFR calc Af Amer: >60
GFR calc Af Amer: >60
GFR calc Af Amer: >60
GFR calc Af Amer: >60
GFR calc Af Amer: >60
GFR calc Af Amer: >60
GFR calc non Af Amer: >60
GFR calc non Af Amer: >60
GFR calc non Af Amer: >60
GFR calc non Af Amer: >60
GFR calc non Af Amer: >60
Glucose, Bld: 105 — ABNORMAL HIGH
Glucose, Bld: 121 — ABNORMAL HIGH
Glucose, Bld: 91
Glucose, Bld: 96
Potassium: 4
Potassium: 4.1
Sodium: 137
Sodium: 138
Sodium: 138
Sodium: 138
Sodium: 138
Sodium: 140
Sodium: 138

## 2011-06-22 LAB — CBC
HCT: 35.2 — ABNORMAL LOW
HCT: 42.3
Hemoglobin: 12 — ABNORMAL LOW
Hemoglobin: 14.3
Hemoglobin: 12.4 — ABNORMAL LOW
Hemoglobin: 13.3
MCHC: 33.9
MCHC: 33.9
MCV: 82.8
RBC: 4.24
RBC: 4.43
RBC: 4.76
RDW: 13.1
WBC: 4.9
WBC: 6.9

## 2011-06-22 LAB — URINALYSIS, ROUTINE W REFLEX MICROSCOPIC
Bilirubin Urine: NEGATIVE
Glucose, UA: NEGATIVE
Glucose, UA: NEGATIVE
Glucose, UA: NEGATIVE
Ketones, ur: 15 — AB
Ketones, ur: NEGATIVE
Leukocytes, UA: NEGATIVE
Leukocytes, UA: NEGATIVE
Leukocytes, UA: NEGATIVE
Nitrite: NEGATIVE
Nitrite: NEGATIVE
Protein, ur: NEGATIVE
Protein, ur: NEGATIVE
Protein, ur: NEGATIVE
Specific Gravity, Urine: 1.025
Urobilinogen, UA: 1
Urobilinogen, UA: 0.2
pH: 6
pH: 5.5

## 2011-06-22 LAB — I-STAT 8, (EC8 V) (CONVERTED LAB)
BUN: 17
Chloride: 108
HCT: 40
Hemoglobin: 13.6
Operator id: 277751
Sodium: 140

## 2011-06-22 LAB — URINE MICROSCOPIC-ADD ON

## 2011-06-22 LAB — CULTURE, BLOOD (ROUTINE X 2)

## 2011-06-22 LAB — D-DIMER, QUANTITATIVE: D-Dimer, Quant: 5.07 — ABNORMAL HIGH

## 2011-06-22 LAB — BILIRUBIN, DIRECT: Bilirubin, Direct: 0.1

## 2011-06-22 LAB — CK TOTAL AND CKMB (NOT AT ARMC)
CK, MB: 9.4 — ABNORMAL HIGH
Total CK: 30966 — ABNORMAL HIGH

## 2011-06-22 LAB — URINE CULTURE
Colony Count: NO GROWTH
Culture: NO GROWTH

## 2011-06-22 LAB — RAPID URINE DRUG SCREEN, HOSP PERFORMED: Tetrahydrocannabinol: POSITIVE — AB

## 2011-06-22 LAB — HSV 1 ANTIBODY, IGG: HSV 1 Glycoprotein G Ab, IgG: 7.03 — ABNORMAL HIGH

## 2011-07-21 ENCOUNTER — Emergency Department (HOSPITAL_COMMUNITY)
Admission: EM | Admit: 2011-07-21 | Discharge: 2011-07-21 | Discharge status: Against Medical Advice | Payer: Medicaid Other | Attending: Emergency Medicine | Admitting: Emergency Medicine

## 2011-07-21 ENCOUNTER — Emergency Department (HOSPITAL_COMMUNITY): Payer: Medicaid Other

## 2011-07-21 DIAGNOSIS — R51 Headache: Secondary | ICD-10-CM | POA: Insufficient documentation

## 2012-03-04 ENCOUNTER — Ambulatory Visit: Payer: Medicaid Other | Admitting: Family Medicine

## 2012-08-17 ENCOUNTER — Emergency Department (HOSPITAL_BASED_OUTPATIENT_CLINIC_OR_DEPARTMENT_OTHER)
Admission: EM | Admit: 2012-08-17 | Discharge: 2012-08-17 | Disposition: A | Discharge status: Home / Self Care | Payer: Medicaid Other | Attending: Emergency Medicine | Admitting: Emergency Medicine

## 2012-08-17 ENCOUNTER — Encounter (HOSPITAL_BASED_OUTPATIENT_CLINIC_OR_DEPARTMENT_OTHER): Payer: Self-pay | Admitting: *Deleted

## 2012-08-17 DIAGNOSIS — M791 Myalgia, unspecified site: Secondary | ICD-10-CM

## 2012-08-17 DIAGNOSIS — IMO0001 Reserved for inherently not codable concepts without codable children: Secondary | ICD-10-CM | POA: Insufficient documentation

## 2012-08-17 DIAGNOSIS — F17200 Nicotine dependence, unspecified, uncomplicated: Secondary | ICD-10-CM | POA: Insufficient documentation

## 2012-08-17 DIAGNOSIS — J45909 Unspecified asthma, uncomplicated: Secondary | ICD-10-CM | POA: Insufficient documentation

## 2012-08-17 DIAGNOSIS — R51 Headache: Secondary | ICD-10-CM

## 2012-08-17 HISTORY — DX: Fibromyalgia: M79.7

## 2012-08-17 HISTORY — DX: Unspecified asthma, uncomplicated: J45.909

## 2012-08-17 MED ORDER — METOCLOPRAMIDE HCL 5 MG/ML IJ SOLN
10.0000 mg | Freq: Once | INTRAMUSCULAR | Status: AC
  Administered 2012-08-17: 10 mg via INTRAMUSCULAR
  Filled 2012-08-17: qty 2

## 2012-08-17 MED ORDER — DIPHENHYDRAMINE HCL 50 MG/ML IJ SOLN
25.0000 mg | Freq: Once | INTRAMUSCULAR | Status: AC
  Administered 2012-08-17: 25 mg via INTRAMUSCULAR
  Filled 2012-08-17: qty 1

## 2012-08-17 MED ORDER — KETOROLAC TROMETHAMINE 30 MG/ML IJ SOLN
60.0000 mg | Freq: Once | INTRAMUSCULAR | Status: AC
  Administered 2012-08-17: 60 mg via INTRAMUSCULAR
  Filled 2012-08-17: qty 2

## 2012-08-17 NOTE — ED Provider Notes (Signed)
Medical screening examination/treatment/procedure(s) were performed by non-physician practitioner and as supervising physician I was immediately available for consultation/collaboration.  John-Adam Klohe Lovering, M.D.     John-Adam Annaliz Aven, MD 08/17/12 1604 

## 2012-08-17 NOTE — ED Provider Notes (Signed)
History     CSN: [id number]  Arrival date & time 08/17/12  1326   First MD Initiated Contact with Patient 08/17/12 1334      Chief Complaint  Patient presents with  . Generalized Body Aches    (Consider location/radiation/quality/duration/timing/severity/associated sxs/prior treatment) HPI Comments: Pt states that he has had generalized body aches for 2 years and he was put on cymbalta and trazadone yesterday by his doctor:pt is here today because he is continuing to have pain and also because he has a headache:pt denies headache being the worst headache, pt states that he is prone to headaches:pt states no n/v/fever or visual changes:headache is across the forehead:pt states that he is here hoping for some pain relief  The history is provided by the patient. No language interpreter was used.    Past Medical History  Diagnosis Date  . Asthma   . Fibromyalgia     Past Surgical History  Procedure Date  . Eye surgery     History reviewed. No pertinent family history.  History  Substance Use Topics  . Smoking status: Current Every Day Smoker -- 0.5 packs/day  . Smokeless tobacco: Not on file  . Alcohol Use: No      Review of Systems  Constitutional: Negative.   Respiratory: Negative.   Cardiovascular: Negative.     Allergies  Review of patient's allergies indicates no known allergies.  Home Medications  No current outpatient prescriptions on file.  BP 117/71  Pulse 78  Temp 98.3 F (36.8 C) (Oral)  Resp 16  Ht 5\' 6"  (1.676 m)  Wt 138 lb (62.596 kg)  BMI 22.27 kg/m2  SpO2 100%  Physical Exam  Vitals reviewed. Constitutional: He is oriented to person, place, and time. He appears well-developed and well-nourished.  HENT:  Head: Normocephalic and atraumatic.  Mouth/Throat: Oropharynx is clear and moist.  Eyes: Conjunctivae normal and EOM are normal. Pupils are equal, round, and reactive to light.  Neck: Normal range of motion. Neck supple.       No  meningeal signs  Pulmonary/Chest: Effort normal and breath sounds normal.  Musculoskeletal: Normal range of motion.  Neurological: He is alert and oriented to person, place, and time.  Skin: Skin is warm and dry.  Psychiatric: He has a normal mood and affect.    ED Course  Procedures (including critical care time)  Labs Reviewed - No data to display No results found.   1. Headache   2. Myalgia       MDM  Pt is okay to follow up as needed:pt has a history of rhabo:history not consistent with that         Teressa Lower, NP 08/17/12 1518

## 2012-08-17 NOTE — ED Notes (Signed)
Pt drinking sprite, resting. Pt reports he feels better and has a ride to go home.

## 2012-08-17 NOTE — ED Notes (Signed)
Pt c/o body aches with h/a x 3 days

## 2013-03-07 ENCOUNTER — Ambulatory Visit (HOSPITAL_COMMUNITY)
Admission: RE | Admit: 2013-03-07 | Discharge: 2013-03-07 | Disposition: A | Discharge status: Home / Self Care | Payer: Medicaid Other | Source: Ambulatory Visit | Attending: Obstetrics and Gynecology | Admitting: Obstetrics and Gynecology

## 2013-03-07 DIAGNOSIS — Z31448 Encounter for other genetic testing of male for procreative management: Secondary | ICD-10-CM | POA: Insufficient documentation

## 2013-04-09 ENCOUNTER — Encounter (HOSPITAL_COMMUNITY): Payer: Self-pay | Admitting: Emergency Medicine

## 2013-04-09 ENCOUNTER — Emergency Department (HOSPITAL_COMMUNITY)
Admission: EM | Admit: 2013-04-09 | Discharge: 2013-04-09 | Disposition: A | Discharge status: Home / Self Care | Payer: Medicaid Other | Attending: Emergency Medicine | Admitting: Emergency Medicine

## 2013-04-09 DIAGNOSIS — F17200 Nicotine dependence, unspecified, uncomplicated: Secondary | ICD-10-CM | POA: Insufficient documentation

## 2013-04-09 DIAGNOSIS — R112 Nausea with vomiting, unspecified: Secondary | ICD-10-CM | POA: Insufficient documentation

## 2013-04-09 DIAGNOSIS — H5789 Other specified disorders of eye and adnexa: Secondary | ICD-10-CM | POA: Insufficient documentation

## 2013-04-09 DIAGNOSIS — H579 Unspecified disorder of eye and adnexa: Secondary | ICD-10-CM | POA: Insufficient documentation

## 2013-04-09 DIAGNOSIS — Z8739 Personal history of other diseases of the musculoskeletal system and connective tissue: Secondary | ICD-10-CM | POA: Insufficient documentation

## 2013-04-09 DIAGNOSIS — Z9889 Other specified postprocedural states: Secondary | ICD-10-CM | POA: Insufficient documentation

## 2013-04-09 DIAGNOSIS — J45909 Unspecified asthma, uncomplicated: Secondary | ICD-10-CM | POA: Insufficient documentation

## 2013-04-09 DIAGNOSIS — H109 Unspecified conjunctivitis: Secondary | ICD-10-CM | POA: Insufficient documentation

## 2013-04-09 MED ORDER — TETRACAINE HCL 0.5 % OP SOLN
2.0000 [drp] | Freq: Once | OPHTHALMIC | Status: AC
  Administered 2013-04-09: 2 [drp] via OPHTHALMIC
  Filled 2013-04-09: qty 2

## 2013-04-09 MED ORDER — TOBRAMYCIN 0.3 % OP SOLN
2.0000 [drp] | OPHTHALMIC | Status: DC
  Administered 2013-04-09: 2 [drp] via OPHTHALMIC
  Filled 2013-04-09: qty 5

## 2013-04-09 MED ORDER — FLUORESCEIN SODIUM 1 MG OP STRP
1.0000 | ORAL_STRIP | Freq: Once | OPHTHALMIC | Status: AC
  Administered 2013-04-09: 1 via OPHTHALMIC
  Filled 2013-04-09: qty 1

## 2013-04-09 NOTE — ED Notes (Signed)
PT. REPORTS LEFT EYE PAIN WITH REDDNESS AND DRAINAGE ONSET THIS AFTERNOON .

## 2013-04-09 NOTE — ED Provider Notes (Signed)
   History    CSN: [id number] Arrival date & time 04/09/13  2207  First MD Initiated Contact with Patient 04/09/13 2218     Chief Complaint  Patient presents with  . Eye Pain   HPI  History provided by the patient. Patient is a 27 year old male with no significant PMH presenting with complaints of left eye redness, irritation and drainage. Symptoms first began earlier in the day with slight irritation. Patient had eyelash in his eye. His eye continued to be slightly irritated and he went to the sleep for naps at noon and when he awoke he had drainage and crusting around his eyelashes and eyelids. His eyes continue to be red with a slight irritation and itch. There is continued tearing of the eye. He denies any vision changes. He denies severe pains. Denies any other recent symptoms. No nasal congestion, rhinorrhea or cough. He was feeling slightly ill yesterday with nausea and one episode of vomiting. No diarrhea. No other aggravating or alleviating factors. No other associated symptoms.    Past Medical History  Diagnosis Date  . Asthma   . Fibromyalgia    Past Surgical History  Procedure Laterality Date  . Eye surgery     No family history on file. History  Substance Use Topics  . Smoking status: Current Every Day Smoker -- 0.50 packs/day  . Smokeless tobacco: Not on file  . Alcohol Use: No    Review of Systems  Constitutional: Negative for fever, chills and diaphoresis.  HENT: Negative for congestion, sore throat and rhinorrhea.   Eyes: Positive for discharge, redness and itching. Negative for photophobia and pain.  Respiratory: Negative for cough.   All other systems reviewed and are negative.    Allergies  Review of patient's allergies indicates no known allergies.  Home Medications  No current outpatient prescriptions on file. BP 142/83  Pulse 72  Temp(Src) 98.3 F (36.8 C) (Oral)  Resp 16  SpO2 100% Physical Exam  Nursing note and vitals  reviewed. Constitutional: He is oriented to person, place, and time. He appears well-developed and well-nourished. No distress.  HENT:  Head: Normocephalic.  Right Ear: Tympanic membrane normal.  Left Ear: Tympanic membrane normal.  Nose: Nose normal. No rhinorrhea.  Mouth/Throat: Oropharynx is clear and moist.  Eyes: EOM are normal. Pupils are equal, round, and reactive to light. No foreign bodies found. Right conjunctiva is not injected. Right conjunctiva has no hemorrhage. Left conjunctiva is injected. Left conjunctiva has no hemorrhage.  Slit lamp exam:      The left eye shows no corneal abrasion, no corneal flare, no corneal ulcer, no foreign body, no hyphema, no hypopyon and no fluorescein uptake.  Neck: Normal range of motion. Neck supple.  Cardiovascular: Normal rate and regular rhythm.   Pulmonary/Chest: Effort normal and breath sounds normal. No respiratory distress.  Abdominal: Soft.  Musculoskeletal: Normal range of motion.  Lymphadenopathy:    He has no cervical adenopathy.  Neurological: He is alert and oriented to person, place, and time.  Skin: Skin is warm. No rash noted. No erythema.  Psychiatric: He has a normal mood and affect. His behavior is normal.    ED Course  Procedures     1. Conjunctivitis      MDM  10:30PM patient seen and evaluated. Patient well-appearing in no acute distress. He does not appear severely ill or toxic.  Angus Seller, PA-C 04/10/13 [phone]

## 2013-04-11 NOTE — ED Provider Notes (Signed)
Medical screening examination/treatment/procedure(s) were performed by non-physician practitioner and as supervising physician I was immediately available for consultation/collaboration.  Lyanne Co, MD 04/11/13 [phone]

## 2013-06-30 ENCOUNTER — Encounter (HOSPITAL_BASED_OUTPATIENT_CLINIC_OR_DEPARTMENT_OTHER): Payer: Self-pay | Admitting: *Deleted

## 2013-06-30 ENCOUNTER — Emergency Department (HOSPITAL_BASED_OUTPATIENT_CLINIC_OR_DEPARTMENT_OTHER)
Admission: EM | Admit: 2013-06-30 | Discharge: 2013-06-30 | Disposition: A | Discharge status: Home / Self Care | Payer: Medicaid Other | Attending: Emergency Medicine | Admitting: Emergency Medicine

## 2013-06-30 DIAGNOSIS — J45909 Unspecified asthma, uncomplicated: Secondary | ICD-10-CM | POA: Insufficient documentation

## 2013-06-30 DIAGNOSIS — K089 Disorder of teeth and supporting structures, unspecified: Secondary | ICD-10-CM | POA: Insufficient documentation

## 2013-06-30 DIAGNOSIS — K0889 Other specified disorders of teeth and supporting structures: Secondary | ICD-10-CM

## 2013-06-30 DIAGNOSIS — Z79899 Other long term (current) drug therapy: Secondary | ICD-10-CM | POA: Insufficient documentation

## 2013-06-30 DIAGNOSIS — F17200 Nicotine dependence, unspecified, uncomplicated: Secondary | ICD-10-CM | POA: Insufficient documentation

## 2013-06-30 DIAGNOSIS — IMO0001 Reserved for inherently not codable concepts without codable children: Secondary | ICD-10-CM | POA: Insufficient documentation

## 2013-06-30 MED ORDER — IBUPROFEN 600 MG PO TABS: 600.0000 mg | ORAL_TABLET | Freq: Four times a day (QID) | ORAL | Status: DC | PRN

## 2013-06-30 MED ORDER — IBUPROFEN 400 MG PO TABS
600.0000 mg | ORAL_TABLET | Freq: Once | ORAL | Status: AC
  Administered 2013-06-30: 600 mg via ORAL
  Filled 2013-06-30 (×2): qty 1

## 2013-06-30 NOTE — ED Provider Notes (Signed)
CSN: [id number]     Arrival date & time 06/30/13  0443 History   First MD Initiated Contact with Patient 06/30/13 [phone]     Chief Complaint  Patient presents with  . Dental Pain   (Consider location/radiation/quality/duration/timing/severity/associated sxs/prior Treatment) HPI Patient presents with one day of right upper dental pain after losing his filling yesterday. He denies any intraoral masses or fever. He is coming today March hasn't taken any medication for pain. Past Medical History  Diagnosis Date  . Asthma   . Fibromyalgia    Past Surgical History  Procedure Laterality Date  . Eye surgery     No family history on file. History  Substance Use Topics  . Smoking status: Current Every Day Smoker -- 0.50 packs/day  . Smokeless tobacco: Not on file  . Alcohol Use: No    Review of Systems  Constitutional: Negative for fever and chills.  HENT: Positive for dental problem. Negative for sore throat, trouble swallowing, neck pain, neck stiffness and voice change.   Respiratory: Negative for shortness of breath and wheezing.   All other systems reviewed and are negative.    Allergies  Review of patient's allergies indicates no known allergies.  Home Medications   Current Outpatient Rx  Name  Route  Sig  Dispense  Refill  . amitriptyline (ELAVIL) 25 MG tablet   Oral   Take 25 mg by mouth at bedtime.         . DULoxetine (CYMBALTA) 60 MG capsule   Oral   Take 60 mg by mouth daily.         . traZODone (DESYREL) 100 MG tablet   Oral   Take 100 mg by mouth at bedtime.         Marland Kitchen ibuprofen (ADVIL,MOTRIN) 600 MG tablet   Oral   Take 1 tablet (600 mg total) by mouth every 6 (six) hours as needed for pain.   30 tablet   0    BP 118/76  Pulse 62  Temp(Src) 98.1 F (36.7 C) (Oral)  Resp 16  Ht 5\' 6"  (1.676 m)  Wt 138 lb (62.596 kg)  BMI 22.28 kg/m2  SpO2 100% Physical Exam  Nursing note and vitals reviewed. Constitutional: He is oriented to person,  place, and time. He appears well-developed and well-nourished. No distress.  Patient is very well-appearing.  HENT:  Head: Normocephalic and atraumatic.  Mouth/Throat: Oropharynx is clear and moist.  Patient has multiple dental caries. He is tender to palpation over his right upper premolar. There is no evidence of any periapical abscesses  Eyes: EOM are normal. Pupils are equal, round, and reactive to light.  Neck: Normal range of motion. Neck supple.  Cardiovascular: Normal rate and regular rhythm.   Pulmonary/Chest: Effort normal and breath sounds normal. No respiratory distress. He has no wheezes. He has no rales.  Abdominal: Soft. Bowel sounds are normal.  Musculoskeletal: Normal range of motion. He exhibits no edema and no tenderness.  Lymphadenopathy:    He has no cervical adenopathy.  Neurological: He is alert and oriented to person, place, and time.  Skin: Skin is warm and dry. No rash noted. No erythema.  Psychiatric: He has a normal mood and affect. His behavior is normal.    ED Course  Procedures (including critical care time) Labs Review Labs Reviewed - No data to display Imaging Review No results found.  MDM   1. Pain, dental    Patient treated with anti-inflammatory medication for pain. He is  advised to follow with the dentist penicillin. Return precautions have been given.   Loren Racer, MD 06/30/13 [phone]

## 2013-06-30 NOTE — ED Notes (Addendum)
Pt sts the filling in his right upper molar came out and he is now having pain in that tooth. Pt is a resident of [facility].

## 2013-10-25 ENCOUNTER — Encounter (HOSPITAL_COMMUNITY): Payer: Self-pay | Admitting: Emergency Medicine

## 2013-10-25 DIAGNOSIS — F17200 Nicotine dependence, unspecified, uncomplicated: Secondary | ICD-10-CM | POA: Insufficient documentation

## 2013-10-25 DIAGNOSIS — J45909 Unspecified asthma, uncomplicated: Secondary | ICD-10-CM | POA: Insufficient documentation

## 2013-10-25 DIAGNOSIS — R1011 Right upper quadrant pain: Secondary | ICD-10-CM | POA: Insufficient documentation

## 2013-10-25 DIAGNOSIS — Z79899 Other long term (current) drug therapy: Secondary | ICD-10-CM | POA: Insufficient documentation

## 2013-10-25 DIAGNOSIS — IMO0001 Reserved for inherently not codable concepts without codable children: Secondary | ICD-10-CM | POA: Insufficient documentation

## 2013-10-25 LAB — CBC WITH DIFFERENTIAL/PLATELET
Basophils Absolute: 0 10*3/uL (ref 0.0–0.1)
Basophils Relative: 1 % (ref 0–1)
EOS ABS: 0 10*3/uL (ref 0.0–0.7)
Eosinophils Relative: 1 % (ref 0–5)
HCT: 43.6 % (ref 39.0–52.0)
Hemoglobin: 15.1 g/dL (ref 13.0–17.0)
Lymphocytes Relative: 32 % (ref 12–46)
Lymphs Abs: 2.1 10*3/uL (ref 0.7–4.0)
MCH: 28.8 pg (ref 26.0–34.0)
MCHC: 34.6 g/dL (ref 30.0–36.0)
MCV: 83.2 fL (ref 78.0–100.0)
Monocytes Absolute: 0.6 10*3/uL (ref 0.1–1.0)
Monocytes Relative: 9 % (ref 3–12)
Neutro Abs: 3.8 10*3/uL (ref 1.7–7.7)
Neutrophils Relative %: 58 % (ref 43–77)
PLATELETS: 199 10*3/uL (ref 150–400)
RBC: 5.24 MIL/uL (ref 4.22–5.81)
RDW: 12.8 % (ref 11.5–15.5)
WBC: 6.5 10*3/uL (ref 4.0–10.5)

## 2013-10-25 NOTE — ED Notes (Signed)
Pt states that he is having upper abdominal pain. Pt states that the pain started 3 days ago. Pt states that the pain is constant and does not relate to food intake. Pt denies N/V/D.

## 2013-10-26 ENCOUNTER — Emergency Department (HOSPITAL_COMMUNITY)
Admission: EM | Admit: 2013-10-26 | Discharge: 2013-10-26 | Disposition: A | Discharge status: Home / Self Care | Payer: Medicaid Other | Attending: Emergency Medicine | Admitting: Emergency Medicine

## 2013-10-26 ENCOUNTER — Emergency Department (HOSPITAL_COMMUNITY): Payer: Medicaid Other

## 2013-10-26 DIAGNOSIS — R109 Unspecified abdominal pain: Secondary | ICD-10-CM

## 2013-10-26 LAB — RAPID URINE DRUG SCREEN, HOSP PERFORMED
Amphetamines: NOT DETECTED
Barbiturates: NOT DETECTED
Benzodiazepines: NOT DETECTED
COCAINE: NOT DETECTED
OPIATES: NOT DETECTED
TETRAHYDROCANNABINOL: POSITIVE — AB

## 2013-10-26 LAB — URINALYSIS, ROUTINE W REFLEX MICROSCOPIC
Bilirubin Urine: NEGATIVE
Glucose, UA: NEGATIVE mg/dL
Hgb urine dipstick: NEGATIVE
Ketones, ur: 15 mg/dL — AB
LEUKOCYTES UA: NEGATIVE
NITRITE: NEGATIVE
Protein, ur: NEGATIVE mg/dL
SPECIFIC GRAVITY, URINE: 1.031 — AB (ref 1.005–1.030)
UROBILINOGEN UA: 1 mg/dL (ref 0.0–1.0)
pH: 6 (ref 5.0–8.0)

## 2013-10-26 LAB — COMPREHENSIVE METABOLIC PANEL
ALT: 14 U/L (ref 0–53)
AST: 19 U/L (ref 0–37)
Albumin: 4.1 g/dL (ref 3.5–5.2)
Alkaline Phosphatase: 74 U/L (ref 39–117)
BUN: 17 mg/dL (ref 6–23)
CALCIUM: 9.2 mg/dL (ref 8.4–10.5)
CO2: 20 mEq/L (ref 19–32)
Chloride: 103 mEq/L (ref 96–112)
Creatinine, Ser: 0.88 mg/dL (ref 0.50–1.35)
GFR calc non Af Amer: >90 mL/min (ref >90)
Glucose, Bld: 105 mg/dL — ABNORMAL HIGH (ref 70–99)
Potassium: 4 mEq/L (ref 3.7–5.3)
SODIUM: 137 meq/L (ref 137–147)
TOTAL PROTEIN: 7.4 g/dL (ref 6.0–8.3)
Total Bilirubin: <0.2 mg/dL — ABNORMAL LOW (ref 0.3–1.2)

## 2013-10-26 LAB — LIPASE, BLOOD: Lipase: 27 U/L (ref 11–59)

## 2013-10-26 MED ORDER — GI COCKTAIL ~~LOC~~
30.0000 mL | Freq: Once | ORAL | Status: AC
  Administered 2013-10-26: 30 mL via ORAL
  Filled 2013-10-26: qty 30

## 2013-10-26 MED ORDER — OXYCODONE-ACETAMINOPHEN 5-325 MG PO TABS
2.0000 | ORAL_TABLET | Freq: Once | ORAL | Status: AC
  Administered 2013-10-26: 2 via ORAL
  Filled 2013-10-26: qty 2

## 2013-10-26 MED ORDER — FAMOTIDINE IN NACL 20-0.9 MG/50ML-% IV SOLN
20.0000 mg | Freq: Once | INTRAVENOUS | Status: AC
  Administered 2013-10-26: 20 mg via INTRAVENOUS
  Filled 2013-10-26: qty 50

## 2013-10-26 MED ORDER — FAMOTIDINE 20 MG PO TABS: 20.0000 mg | ORAL_TABLET | Freq: Two times a day (BID) | ORAL | Status: DC

## 2013-10-26 MED ORDER — PANTOPRAZOLE SODIUM 40 MG IV SOLR
40.0000 mg | INTRAVENOUS | Status: AC
  Administered 2013-10-26: 40 mg via INTRAVENOUS
  Filled 2013-10-26: qty 40

## 2013-10-26 NOTE — Discharge Instructions (Signed)
Please call your doctor for a followup appointment within 24-48 hours. When you talk to your doctor please let them know that you were seen in the emergency department and have them acquire all of your records so that they can discuss the findings with you and formulate a treatment plan to fully care for your new and ongoing problems. ° °

## 2013-10-26 NOTE — ED Provider Notes (Signed)
CSN: [id number]     Arrival date & time 10/25/13  2331 History   First MD Initiated Contact with Patient 10/26/13 0220     Chief Complaint  Patient presents with  . Abdominal Pain   (Consider location/radiation/quality/duration/timing/severity/associated sxs/prior Treatment) HPI Comments: 28 year old male, no significant past medical history, no history of abdominal surgery, presents with complaint of epigastric and right upper quadrant pain. The patient states that this started approximately 3 days ago, it is constant, severe at times, does not radiate, not associated with nausea vomiting diarrhea or dysuria. Eating does not make the pain worse, no increased pain with bowel movements which have been normal and no increased pain with urination. He has never had abdominal surgery, never had kidney stones and denies any hematuria.  Patient is a 28 y.o. male presenting with abdominal pain. The history is provided by the patient.  Abdominal Pain   Past Medical History  Diagnosis Date  . Asthma   . Fibromyalgia    Past Surgical History  Procedure Laterality Date  . Eye surgery     History reviewed. No pertinent family history. History  Substance Use Topics  . Smoking status: Current Every Day Smoker -- 0.50 packs/day  . Smokeless tobacco: Not on file  . Alcohol Use: No    Review of Systems  Gastrointestinal: Positive for abdominal pain.  All other systems reviewed and are negative.    Allergies  Review of patient's allergies indicates no known allergies.  Home Medications   Current Outpatient Rx  Name  Route  Sig  Dispense  Refill  . acetaminophen (TYLENOL) 500 MG tablet   Oral   Take 1,000 mg by mouth every 6 (six) hours as needed for moderate pain.         Marland Kitchen amitriptyline (ELAVIL) 25 MG tablet   Oral   Take 25 mg by mouth at bedtime.         . DULoxetine (CYMBALTA) 60 MG capsule   Oral   Take 60 mg by mouth daily.         Marland Kitchen ibuprofen (ADVIL,MOTRIN) 800 MG  tablet   Oral   Take 800 mg by mouth every 8 (eight) hours as needed for moderate pain.         . traZODone (DESYREL) 100 MG tablet   Oral   Take 100 mg by mouth at bedtime.         . famotidine (PEPCID) 20 MG tablet   Oral   Take 1 tablet (20 mg total) by mouth 2 (two) times daily.   30 tablet   0    BP 134/85  Pulse 119  Temp(Src) 98 F (36.7 C) (Oral)  Resp 18  SpO2 98% Physical Exam  Nursing note and vitals reviewed. Constitutional: He appears well-developed and well-nourished. No distress.  HENT:  Head: Normocephalic and atraumatic.  Mouth/Throat: Oropharynx is clear and moist. No oropharyngeal exudate.  Eyes: Conjunctivae and EOM are normal. Pupils are equal, round, and reactive to light. Right eye exhibits no discharge. Left eye exhibits no discharge. No scleral icterus.  Neck: Normal range of motion. Neck supple. No JVD present. No thyromegaly present.  Cardiovascular: Normal rate, regular rhythm, normal heart sounds and intact distal pulses.  Exam reveals no gallop and no friction rub.   No murmur heard. Pulmonary/Chest: Effort normal and breath sounds normal. No respiratory distress. He has no wheezes. He has no rales.  Abdominal: Soft. Bowel sounds are normal. He exhibits no distension and no  mass. There is tenderness ( Right upper cautery and and epigastric tenderness, no Murphy's sign, no guarding, no peritoneal signs, no pain at McBurney's point).  Musculoskeletal: Normal range of motion. He exhibits no edema and no tenderness.  Lymphadenopathy:    He has no cervical adenopathy.  Neurological: He is alert. Coordination normal.  Skin: Skin is warm and dry. No rash noted. No erythema.  Psychiatric: He has a normal mood and affect. His behavior is normal.    ED Course  Procedures (including critical care time) Labs Review Labs Reviewed  COMPREHENSIVE METABOLIC PANEL - Abnormal; Notable for the following:    Glucose, Bld 105 (*)    Total Bilirubin <0.2 (*)     All other components within normal limits  URINALYSIS, ROUTINE W REFLEX MICROSCOPIC - Abnormal; Notable for the following:    Specific Gravity, Urine 1.031 (*)    Ketones, ur 15 (*)    All other components within normal limits  URINE RAPID DRUG SCREEN (HOSP PERFORMED) - Abnormal; Notable for the following:    Tetrahydrocannabinol POSITIVE (*)    All other components within normal limits  CBC WITH DIFFERENTIAL  LIPASE, BLOOD   Imaging Review US Abdomen Complete  10/26/2013   CLINICAL DATA:  Abdominal pain  EXAM: ULTRASOUND ABDOMEN COMPLETE  COMPARISON:  None.  FINDINGS: Gallbladder:  No gallstones or wall thickening visualized. No sonographic Murphy sign noted.  Common bile duct:  Diameter: 4 mm, within normal limits.  Liver:  No focal lesion identified. Within normal limits in parenchymal echogenicity.  IVC:  No abnormality visualized.  Pancreas:  Visualized portion unremarkable.  Spleen:  Size and appearance within normal limits.  Right Kidney:  Length: 10 cm. Echogenicity within normal limits. No mass or hydronephrosis visualized.  Left Kidney:  Length: 11 cm. Echogenicity within normal limits. No mass or hydronephrosis visualized.  Abdominal aorta:  No aneurysm visualized.  Other findings:  None.  IMPRESSION: Abdominal ultrasound within normal limits.   Electronically Signed   By: Carlos Levering M.D.   On: 10/26/2013 03:32    EKG Interpretation   None       MDM   1. Abdominal pain    The patient has a benign abdominal exam in the lower abdomen but does have tenderness in the right upper quadrant which could be consistent with cholecystitis. On my exam he is not tachycardic, his vital signs are normal, his laboratory workup shows no leukocytosis and normal liver function and lipase testing. Ultrasound will be performed to evaluate the gallbladder, will treat with Pepcid and Protonix as this could also be a peptic ulcer.  Labs and [place] neg - UA clean,   Possible PUD, meds  started  Meds given in ED:  Medications  gi cocktail (Maalox,Lidocaine,Donnatal) (30 mLs Oral Given 10/26/13 0316)  famotidine (PEPCID) IVPB 20 mg (0 mg Intravenous Stopped 10/26/13 0405)  pantoprazole (PROTONIX) injection 40 mg (40 mg Intravenous Given 10/26/13 0333)  oxyCODONE-acetaminophen (PERCOCET/ROXICET) 5-325 MG per tablet 2 tablet (2 tablets Oral Given 10/26/13 0412)    New Prescriptions   FAMOTIDINE (PEPCID) 20 MG TABLET    Take 1 tablet (20 mg total) by mouth 2 (two) times daily.      Johnna Acosta, MD 10/26/13 [phone]

## 2015-01-26 ENCOUNTER — Encounter (HOSPITAL_COMMUNITY): Payer: Self-pay | Admitting: Emergency Medicine

## 2015-01-26 ENCOUNTER — Emergency Department (HOSPITAL_COMMUNITY): Payer: Medicaid Other

## 2015-01-26 ENCOUNTER — Emergency Department (HOSPITAL_COMMUNITY)
Admission: EM | Admit: 2015-01-26 | Discharge: 2015-01-26 | Disposition: A | Discharge status: Home / Self Care | Payer: Medicaid Other | Attending: Emergency Medicine | Admitting: Emergency Medicine

## 2015-01-26 DIAGNOSIS — R05 Cough: Secondary | ICD-10-CM

## 2015-01-26 DIAGNOSIS — B349 Viral infection, unspecified: Secondary | ICD-10-CM

## 2015-01-26 DIAGNOSIS — M542 Cervicalgia: Secondary | ICD-10-CM | POA: Diagnosis not present

## 2015-01-26 DIAGNOSIS — M791 Myalgia, unspecified site: Secondary | ICD-10-CM

## 2015-01-26 DIAGNOSIS — R079 Chest pain, unspecified: Secondary | ICD-10-CM | POA: Diagnosis not present

## 2015-01-26 DIAGNOSIS — R0602 Shortness of breath: Secondary | ICD-10-CM | POA: Diagnosis present

## 2015-01-26 DIAGNOSIS — J45901 Unspecified asthma with (acute) exacerbation: Secondary | ICD-10-CM | POA: Insufficient documentation

## 2015-01-26 DIAGNOSIS — R059 Cough, unspecified: Secondary | ICD-10-CM

## 2015-01-26 DIAGNOSIS — Z720 Tobacco use: Secondary | ICD-10-CM | POA: Diagnosis not present

## 2015-01-26 DIAGNOSIS — Z79899 Other long term (current) drug therapy: Secondary | ICD-10-CM | POA: Diagnosis not present

## 2015-01-26 HISTORY — DX: Benign neoplasm of connective and other soft tissue, unspecified: D21.9

## 2015-01-26 LAB — I-STAT TROPONIN, ED: Troponin i, poc: 0 ng/mL (ref 0.00–0.08)

## 2015-01-26 LAB — BASIC METABOLIC PANEL
ANION GAP: 11 (ref 5–15)
BUN: 6 mg/dL (ref 6–23)
CALCIUM: 9.2 mg/dL (ref 8.4–10.5)
CO2: 20 mmol/L (ref 19–32)
Chloride: 106 mmol/L (ref 96–112)
Creatinine, Ser: 0.72 mg/dL (ref 0.50–1.35)
GFR calc non Af Amer: >90 mL/min (ref >90)
Glucose, Bld: 114 mg/dL — ABNORMAL HIGH (ref 70–99)
Potassium: 3.4 mmol/L — ABNORMAL LOW (ref 3.5–5.1)
SODIUM: 137 mmol/L (ref 135–145)

## 2015-01-26 LAB — CBC
HCT: 40.1 % (ref 39.0–52.0)
HEMOGLOBIN: 13.4 g/dL (ref 13.0–17.0)
MCH: 27.9 pg (ref 26.0–34.0)
MCHC: 33.4 g/dL (ref 30.0–36.0)
MCV: 83.4 fL (ref 78.0–100.0)
Platelets: 163 10*3/uL (ref 150–400)
RBC: 4.81 MIL/uL (ref 4.22–5.81)
RDW: 13.2 % (ref 11.5–15.5)
WBC: 6.5 10*3/uL (ref 4.0–10.5)

## 2015-01-26 LAB — CK: CK TOTAL: 280 U/L — AB (ref 7–232)

## 2015-01-26 MED ORDER — ALBUTEROL SULFATE HFA 108 (90 BASE) MCG/ACT IN AERS: 1.0000 | INHALATION_SPRAY | Freq: Four times a day (QID) | RESPIRATORY_TRACT | Status: AC | PRN

## 2015-01-26 MED ORDER — DULOXETINE HCL 60 MG PO CPEP
60.0000 mg | ORAL_CAPSULE | Freq: Once | ORAL | Status: AC
  Administered 2015-01-26: 60 mg via ORAL
  Filled 2015-01-26: qty 1

## 2015-01-26 MED ORDER — BENZONATATE 100 MG PO CAPS: 100.0000 mg | ORAL_CAPSULE | Freq: Three times a day (TID) | ORAL | Status: DC

## 2015-01-26 MED ORDER — DULOXETINE HCL 60 MG PO CPEP: 60.0000 mg | ORAL_CAPSULE | Freq: Every day | ORAL | Status: DC

## 2015-01-26 MED ORDER — IPRATROPIUM-ALBUTEROL 0.5-2.5 (3) MG/3ML IN SOLN
3.0000 mL | Freq: Once | RESPIRATORY_TRACT | Status: AC
  Administered 2015-01-26: 3 mL via RESPIRATORY_TRACT
  Filled 2015-01-26: qty 3

## 2015-01-26 MED ORDER — AMITRIPTYLINE HCL 25 MG PO TABS
25.0000 mg | ORAL_TABLET | Freq: Once | ORAL | Status: AC
  Administered 2015-01-26: 25 mg via ORAL
  Filled 2015-01-26: qty 1

## 2015-01-26 MED ORDER — GUAIFENESIN 100 MG/5ML PO LIQD: 100.0000 mg | ORAL | Status: DC | PRN

## 2015-01-26 MED ORDER — AMITRIPTYLINE HCL 25 MG PO TABS
25.0000 mg | ORAL_TABLET | Freq: Every day | ORAL | Status: DC
Start: 2015-01-26 — End: 2015-01-26

## 2015-01-26 MED ORDER — METHOCARBAMOL 500 MG PO TABS
1000.0000 mg | ORAL_TABLET | Freq: Once | ORAL | Status: AC
  Administered 2015-01-26: 1000 mg via ORAL
  Filled 2015-01-26: qty 2

## 2015-01-26 MED ORDER — KETOROLAC TROMETHAMINE 30 MG/ML IJ SOLN
30.0000 mg | Freq: Once | INTRAMUSCULAR | Status: AC
  Administered 2015-01-26: 30 mg via INTRAVENOUS
  Filled 2015-01-26: qty 1

## 2015-01-26 MED ORDER — AMITRIPTYLINE HCL 25 MG PO TABS: 25.0000 mg | ORAL_TABLET | Freq: Every day | ORAL | Status: DC

## 2015-01-26 MED ORDER — ALBUTEROL SULFATE HFA 108 (90 BASE) MCG/ACT IN AERS
1.0000 | INHALATION_SPRAY | RESPIRATORY_TRACT | Status: DC | PRN
  Administered 2015-01-26: 2 via RESPIRATORY_TRACT
  Filled 2015-01-26: qty 6.7

## 2015-01-26 NOTE — ED Notes (Signed)
Pt arrives via EMS from home with c/o generalized weakness ongoing for the last couple of days, states about 30 minutes ago started having reproducible chest pain with cough, worse with moving arms/deep breaths and worse with palpation. Low grade fevers at home per EMS. Tylenol about an hour ago, 125mg  solumedrol and 5mg  albuterol on board. Hx of asthma, states he doesn't have an inhaler.

## 2015-01-26 NOTE — ED Provider Notes (Addendum)
CSN: [id number]     Arrival date & time 01/26/15  0128 History  This chart was scribe for Linton Flemings, MD by Judithann Sauger, ED Scribe. The patient was seen in room A09C/A09C and the patient's care was started at 1:52 AM.     Chief Complaint  Patient presents with  . Shortness of Breath  . Chest Pain   The history is provided by the patient. No language interpreter was used.   HPI Comments: SEBERT STOLLINGS is a 29 y.o. male with a hx of asthma, fibromyalgia, and Rhabdomyoma who presents to the Emergency Department by ambulance complaining of HA, chest pain, and neck pain onset 2 days ago. He explains that he had a productive cough of yellow sputum and wheezing 3 days ago but the wheezing has resolved. He reports that he was pressure washing on a ladder 2 days ago. He reports associated fever of 100.9 tonight. He denies getting the flu vaccine this season. He reports taking Tylenol about 2 hours ago with no relief. He states that he smokes about half a pack of cigarettes a day. He reports his daughter has his sick contact because she has a cold. He states that he has been non-compliant with his Elavil and Cymbalta because he ran out of them but he has been compliant with the Trazodone.   Past Medical History  Diagnosis Date  . Asthma   . Fibromyalgia   . Rhabdomyoma    Past Surgical History  Procedure Laterality Date  . Eye surgery     No family history on file. History  Substance Use Topics  . Smoking status: Current Every Day Smoker -- 0.50 packs/day  . Smokeless tobacco: Not on file  . Alcohol Use: No    Review of Systems  Constitutional: Negative for fever and chills.  Respiratory: Positive for cough and wheezing.   Cardiovascular: Positive for chest pain.  Musculoskeletal: Positive for neck pain.  Neurological: Positive for headaches.      Allergies  Review of patient's allergies indicates no known allergies.  Home Medications   Prior to Admission medications    Medication Sig Start Date End Date Taking? Authorizing Provider  acetaminophen (TYLENOL) 500 MG tablet Take 1,000 mg by mouth every 6 (six) hours as needed for moderate pain.    Historical Provider, MD  amitriptyline (ELAVIL) 25 MG tablet Take 25 mg by mouth at bedtime.    Historical Provider, MD  DULoxetine (CYMBALTA) 60 MG capsule Take 60 mg by mouth daily.    Historical Provider, MD  famotidine (PEPCID) 20 MG tablet Take 1 tablet (20 mg total) by mouth 2 (two) times daily. 10/26/13   Noemi Chapel, MD  ibuprofen (ADVIL,MOTRIN) 800 MG tablet Take 800 mg by mouth every 8 (eight) hours as needed for moderate pain.    Historical Provider, MD  traZODone (DESYREL) 100 MG tablet Take 100 mg by mouth at bedtime.    Historical Provider, MD   BP 129/77 mmHg  Pulse 76  Temp(Src) 98.3 F (36.8 C) (Oral)  Resp 16  Ht 5\' 7"  (1.702 m)  Wt 130 lb (58.968 kg)  BMI 20.36 kg/m2  SpO2 99% Physical Exam  Constitutional: He is oriented to person, place, and time. He appears well-developed and well-nourished.  HENT:  Head: Normocephalic and atraumatic.  Nose: Nose normal.  Mouth/Throat: Oropharynx is clear and moist.  Eyes: Conjunctivae and EOM are normal. Pupils are equal, round, and reactive to light.  Neck: Normal range of motion. Neck supple.  No JVD present. No tracheal deviation present. No thyromegaly present.  Cardiovascular: Normal rate, regular rhythm, normal heart sounds and intact distal pulses.  Exam reveals no gallop and no friction rub.   No murmur heard. Pulmonary/Chest: Effort normal and breath sounds normal. No stridor. No respiratory distress. He has no wheezes. He has no rales. He exhibits tenderness (Diffuse tenderness with palpation of the chest).  Abdominal: Soft. Bowel sounds are normal. He exhibits no distension and no mass. There is no tenderness. There is no rebound and no guarding.  Musculoskeletal: Normal range of motion. He exhibits no edema or tenderness.  Lymphadenopathy:     He has no cervical adenopathy.  Neurological: He is alert and oriented to person, place, and time. He displays normal reflexes. He exhibits normal muscle tone. Coordination normal.  Skin: Skin is warm and dry. No rash noted. No erythema. No pallor.  Psychiatric: He has a normal mood and affect. His behavior is normal. Judgment and thought content normal.  Nursing note and vitals reviewed.   ED Course  Procedures (including critical care time) DIAGNOSTIC STUDIES: Oxygen Saturation is 99% on RA, normal by my interpretation.    COORDINATION OF CARE: 2:02 AM- Pt advised of plan for treatment and pt agrees.    Labs Review Labs Reviewed  BASIC METABOLIC PANEL - Abnormal; Notable for the following:    Potassium 3.4 (*)    Glucose, Bld 114 (*)    All other components within normal limits  CK - Abnormal; Notable for the following:    Total CK 280 (*)    All other components within normal limits  CBC  URINALYSIS, ROUTINE W REFLEX MICROSCOPIC  I-STAT TROPOININ, ED    Imaging Review Dg Chest 2 View (if Patient Has Fever And/or Copd)  01/26/2015   CLINICAL DATA:  Acute onset of chest pain, neck pain and headache. Productive cough. Fever. Initial encounter.  EXAM: CHEST  2 VIEW  COMPARISON:  Chest radiograph performed 06/28/2010, and CTA of the chest performed 11/25/2009  FINDINGS: The lungs are well-aerated and clear. There is no evidence of focal opacification, pleural effusion or pneumothorax.  The heart is normal in size; the mediastinal contour is within normal limits. No acute osseous abnormalities are seen.  IMPRESSION: No acute cardiopulmonary process seen.   Electronically Signed   By: Garald Balding M.D.   On: 01/26/2015 03:05     EKG Interpretation   Date/Time:  Saturday January 26 2015 01:35:05 EDT Ventricular Rate:  68 PR Interval:  114 QRS Duration: 85 QT Interval:  353 QTC Calculation: 375 R Axis:   66 Text Interpretation:  Sinus rhythm Borderline short PR interval  Borderline  T wave abnormalities Borderline ST elevation, anterior leads Confirmed by  Euclid Cassetta  MD, Adja Ruff ([id number]) on 01/26/2015 1:43:14 AM      MDM   Final diagnoses:  None   29 year old male with cough, myalgias, shortness of breath.  Patient speaking in whispers so is difficult to get a bleed history.  He reports he has not been taking his fibromyalgia medication either.  No fevers here.  Plan for pain control, chest x-ray.  As he has history of rhabdo, will check baseline CK. I personally performed the services described in this documentation, which was scribed in my presence. The recorded information has been reviewed and is accurate.     Linton Flemings, MD 01/26/15 [place], MD 02/06/15 [phone]

## 2015-01-26 NOTE — Discharge Instructions (Signed)
Cough, Adult  A cough is a reflex that helps clear your throat and airways. It can help heal the body or may be a reaction to an irritated airway. A cough may only last 2 or 3 weeks (acute) or may last more than 8 weeks (chronic).  CAUSES Acute cough:  Viral or bacterial infections. Chronic cough:  Infections.  Allergies.  Asthma.  Post-nasal drip.  Smoking.  Heartburn or acid reflux.  Some medicines.  Chronic lung problems (COPD).  Cancer. SYMPTOMS   Cough.  Fever.  Chest pain.  Increased breathing rate.  High-pitched whistling sound when breathing (wheezing).  Colored mucus that you cough up (sputum). TREATMENT   A bacterial cough may be treated with antibiotic medicine.  A viral cough must run its course and will not respond to antibiotics.  Your caregiver may recommend other treatments if you have a chronic cough. HOME CARE INSTRUCTIONS   Only take over-the-counter or prescription medicines for pain, discomfort, or fever as directed by your caregiver. Use cough suppressants only as directed by your caregiver.  Use a cold steam vaporizer or humidifier in your bedroom or home to help loosen secretions.  Sleep in a semi-upright position if your cough is worse at night.  Rest as needed.  Stop smoking if you smoke. SEEK IMMEDIATE MEDICAL CARE IF:   You have pus in your sputum.  Your cough starts to worsen.  You cannot control your cough with suppressants and are losing sleep.  You begin coughing up blood.  You have difficulty breathing.  You develop pain which is getting worse or is uncontrolled with medicine.  You have a fever. MAKE SURE YOU:   Understand these instructions.  Will watch your condition.  Will get help right away if you are not doing well or get worse. Document Released: 03/13/2011 Document Revised: 12/07/2011 Document Reviewed: 03/13/2011 [facility] Patient Information 2015 [place], [place]. This information is not intended  to replace advice given to you by your health care provider. Make sure you discuss any questions you have with your health care provider.  Fever, Adult A fever is a temperature of 100.4 F (38 C) or above.  HOME CARE  Take fever medicine as told by your doctor. Do not  take aspirin for fever if you are younger than 31 years of age.  If you are given antibiotic medicine, take it as told. Finish the medicine even if you start to feel better.  Rest.  Drink enough fluids to keep your pee (urine) clear or pale yellow. Do not drink alcohol.  Take a bath or shower with room temperature water. Do not use ice water or alcohol sponge baths.  Wear lightweight, loose clothes. GET HELP RIGHT AWAY IF:   You are short of breath or have trouble breathing.  You are very weak.  You are dizzy or you pass out (faint).  You are very thirsty or are making little or no urine.  You have new pain.  You throw up (vomit) or have watery poop (diarrhea).  You keep throwing up or having watery poop for more than 1 to 2 days.  You have a stiff neck or light bothers your eyes.  You have a skin rash.  You have a fever or problems (symptoms) that last for more than 2 to 3 days.  You have a fever and your problems quickly get worse.  You keep throwing up the fluids you drink.  You do not feel better after 3 days.  You  have new problems. MAKE SURE YOU:   Understand these instructions.  Will watch your condition.  Will get help right away if you are not doing well or get worse. Document Released: 06/23/2008 Document Revised: 12/07/2011 Document Reviewed: 07/16/2011 [facility] Patient Information 2015 [place], [place]. This information is not intended to replace advice given to you by your health care provider. Make sure you discuss any questions you have with your health care provider.  Muscle Pain Muscle pain (myalgia) may be caused by many things, including:  Overuse or muscle strain,  especially if you are not in shape. This is the most common cause of muscle pain.  Injury.  Bruises.  Viruses, such as the flu.  Infectious diseases.  Fibromyalgia, which is a chronic condition that causes muscle tenderness, fatigue, and headache.  Autoimmune diseases, including lupus.  Certain drugs, including ACE inhibitors and statins. Muscle pain may be mild or severe. In most cases, the pain lasts only a short time and goes away without treatment. To diagnose the cause of your muscle pain, your health care provider will take your medical history. This means he or she will ask you when your muscle pain began and what has been happening. If you have not had muscle pain for very long, your health care provider may want to wait before doing much testing. If your muscle pain has lasted a long time, your health care provider may want to run tests right away. If your health care provider thinks your muscle pain may be caused by illness, you may need to have additional tests to rule out certain conditions.  Treatment for muscle pain depends on the cause. Home care is often enough to relieve muscle pain. Your health care provider may also prescribe anti-inflammatory medicine. HOME CARE INSTRUCTIONS Watch your condition for any changes. The following actions may help to lessen any discomfort you are feeling:  Only take over-the-counter or prescription medicines as directed by your health care provider.  Apply ice to the sore muscle:  Put ice in a plastic bag.  Place a towel between your skin and the bag.  Leave the ice on for 15-20 minutes, 3-4 times a day.  You may alternate applying hot and cold packs to the muscle as directed by your health care provider.  If overuse is causing your muscle pain, slow down your activities until the pain goes away.  Remember that it is normal to feel some muscle pain after starting a workout program. Muscles that have not been used often will be sore at  first.  Do regular, gentle exercises if you are not usually active.  Warm up before exercising to lower your risk of muscle pain.  Do not continue working out if the pain is very bad. Bad pain could mean you have injured a muscle. SEEK MEDICAL CARE IF:  Your muscle pain gets worse, and medicines do not help.  You have muscle pain that lasts longer than 3 days.  You have a rash or fever along with muscle pain.  You have muscle pain after a tick bite.  You have muscle pain while working out, even though you are in good physical condition.  You have redness, soreness, or swelling along with muscle pain.  You have muscle pain after starting a new medicine or changing the dose of a medicine. SEEK IMMEDIATE MEDICAL CARE IF:  You have trouble breathing.  You have trouble swallowing.  You have muscle pain along with a stiff neck, fever, and vomiting.  You have severe muscle weakness or cannot move part of your body. MAKE SURE YOU:   Understand these instructions.  Will watch your condition.  Will get help right away if you are not doing well or get worse. Document Released: 08/06/2006 Document Revised: 09/19/2013 Document Reviewed: 07/11/2013 [facility] Patient Information 2015 [place], [place]. This information is not intended to replace advice given to you by your health care provider. Make sure you discuss any questions you have with your health care provider.  Viral Infections A viral infection can be caused by different types of viruses.Most viral infections are not serious and resolve on their own. However, some infections may cause severe symptoms and may lead to further complications. SYMPTOMS Viruses can frequently cause:  Minor sore throat.  Aches and pains.  Headaches.  Runny nose.  Different types of rashes.  Watery eyes.  Tiredness.  Cough.  Loss of appetite.  Gastrointestinal infections, resulting in nausea, vomiting, and diarrhea. These symptoms  do not respond to antibiotics because the infection is not caused by bacteria. However, you might catch a bacterial infection following the viral infection. This is sometimes called a "superinfection." Symptoms of such a bacterial infection may include:  Worsening sore throat with pus and difficulty swallowing.  Swollen neck glands.  Chills and a high or persistent fever.  Severe headache.  Tenderness over the sinuses.  Persistent overall ill feeling (malaise), muscle aches, and tiredness (fatigue).  Persistent cough.  Yellow, green, or brown mucus production with coughing. HOME CARE INSTRUCTIONS   Only take over-the-counter or prescription medicines for pain, discomfort, diarrhea, or fever as directed by your caregiver.  Drink enough water and fluids to keep your urine clear or pale yellow. Sports drinks can provide valuable electrolytes, sugars, and hydration.  Get plenty of rest and maintain proper nutrition. Soups and broths with crackers or rice are fine. SEEK IMMEDIATE MEDICAL CARE IF:   You have severe headaches, shortness of breath, chest pain, neck pain, or an unusual rash.  You have uncontrolled vomiting, diarrhea, or you are unable to keep down fluids.  You or your child has an oral temperature above 102 F (38.9 C), not controlled by medicine.  Your baby is older than 3 months with a rectal temperature of 102 F (38.9 C) or higher.  Your baby is 47 months old or younger with a rectal temperature of 100.4 F (38 C) or higher. MAKE SURE YOU:   Understand these instructions.  Will watch your condition.  Will get help right away if you are not doing well or get worse. Document Released: 06/24/2005 Document Revised: 12/07/2011 Document Reviewed: 01/19/2011 [facility] Patient Information 2015 [place], [place]. This information is not intended to replace advice given to you by your health care provider. Make sure you discuss any questions you have with your health care  provider.

## 2016-04-15 ENCOUNTER — Ambulatory Visit (HOSPITAL_COMMUNITY)
Admission: EM | Admit: 2016-04-15 | Discharge: 2016-04-15 | Disposition: A | Discharge status: Home / Self Care | Payer: Medicaid Other | Attending: Emergency Medicine | Admitting: Emergency Medicine

## 2016-04-15 ENCOUNTER — Encounter (HOSPITAL_COMMUNITY): Payer: Self-pay | Admitting: *Deleted

## 2016-04-15 DIAGNOSIS — S39012A Strain of muscle, fascia and tendon of lower back, initial encounter: Secondary | ICD-10-CM | POA: Diagnosis not present

## 2016-04-15 DIAGNOSIS — T1490 Injury, unspecified: Secondary | ICD-10-CM

## 2016-04-15 DIAGNOSIS — R0789 Other chest pain: Secondary | ICD-10-CM | POA: Diagnosis not present

## 2016-04-15 DIAGNOSIS — T148XXA Other injury of unspecified body region, initial encounter: Secondary | ICD-10-CM

## 2016-04-15 DIAGNOSIS — S161XXA Strain of muscle, fascia and tendon at neck level, initial encounter: Secondary | ICD-10-CM

## 2016-04-15 MED ORDER — NAPROXEN 375 MG PO TABS: 375.0000 mg | ORAL_TABLET | Freq: Two times a day (BID) | ORAL | Status: DC

## 2016-04-15 MED ORDER — KETOROLAC TROMETHAMINE 60 MG/2ML IM SOLN: 60.0000 mg | Freq: Once | INTRAMUSCULAR | Status: DC

## 2016-04-15 MED ORDER — HYDROCODONE-ACETAMINOPHEN 5-325 MG PO TABS: 1.0000 | ORAL_TABLET | ORAL | Status: DC | PRN
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
## 2016-04-15 NOTE — ED Notes (Signed)
Pt  Refused  His  toradol injection
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
## 2016-04-15 NOTE — Discharge Instructions (Signed)
Cervical Strain and Sprain With Rehab Cervical strain and sprain are injuries that commonly occur with "whiplash" injuries. Whiplash occurs when the neck is forcefully whipped backward or forward, such as during a motor vehicle accident or during contact sports. The muscles, ligaments, tendons, discs, and nerves of the neck are susceptible to injury when this occurs. RISK FACTORS Risk of having a whiplash injury increases if:  Osteoarthritis of the spine.  Situations that make head or neck accidents or trauma more likely.  High-risk sports (football, rugby, wrestling, hockey, auto racing, gymnastics, diving, contact karate, or boxing).  Poor strength and flexibility of the neck.  Previous neck injury.  Poor tackling technique.  Improperly fitted or padded equipment. SYMPTOMS   Pain or stiffness in the front or back of neck or both.  Symptoms may present immediately or up to 24 hours after injury.  Dizziness, headache, nausea, and vomiting.  Muscle spasm with soreness and stiffness in the neck.  Tenderness and swelling at the injury site. PREVENTION  Learn and use proper technique (avoid tackling with the head, spearing, and head-butting; use proper falling techniques to avoid landing on the head).  Warm up and stretch properly before activity.  Maintain physical fitness:  Strength, flexibility, and endurance.  Cardiovascular fitness.  Wear properly fitted and padded protective equipment, such as padded soft collars, for participation in contact sports. PROGNOSIS  Recovery from cervical strain and sprain injuries is dependent on the extent of the injury. These injuries are usually curable in 1 week to 3 months with appropriate treatment.  RELATED COMPLICATIONS   Temporary numbness and weakness may occur if the nerve roots are damaged, and this may persist until the nerve has completely healed.  Chronic pain due to frequent recurrence of symptoms.  Prolonged healing,  especially if activity is resumed too soon (before complete recovery). TREATMENT  Treatment initially involves the use of ice and medication to help reduce pain and inflammation. It is also important to perform strengthening and stretching exercises and modify activities that worsen symptoms so the injury does not get worse. These exercises may be performed at home or with a therapist. For patients who experience severe symptoms, a soft, padded collar may be recommended to be worn around the neck.  Improving your posture may help reduce symptoms. Posture improvement includes pulling your chin and abdomen in while sitting or standing. If you are sitting, sit in a firm chair with your buttocks against the back of the chair. While sleeping, try replacing your pillow with a small towel rolled to 2 inches in diameter, or use a cervical pillow or soft cervical collar. Poor sleeping positions delay healing.  For patients with nerve root damage, which causes numbness or weakness, the use of a cervical traction apparatus may be recommended. Surgery is rarely necessary for these injuries. However, cervical strain and sprains that are present at birth (congenital) may require surgery. MEDICATION   If pain medication is necessary, nonsteroidal anti-inflammatory medications, such as aspirin and ibuprofen, or other minor pain relievers, such as acetaminophen, are often recommended.  Do not take pain medication for 7 days before surgery.  Prescription pain relievers may be given if deemed necessary by your caregiver. Use only as directed and only as much as you need. HEAT AND COLD:   Cold treatment (icing) relieves pain and reduces inflammation. Cold treatment should be applied for 10 to 15 minutes every 2 to 3 hours for inflammation and pain and immediately after any activity that aggravates your  HEAT AND COLD:   · Cold treatment (icing) relieves pain and reduces inflammation. Cold treatment should be applied for 10 to 15 minutes every 2 to 3 hours for inflammation and pain and immediately after any activity that aggravates your symptoms. Use ice packs or an ice massage.  · Heat treatment may be used prior to performing the stretching and  strengthening activities prescribed by your caregiver, physical therapist, or athletic trainer. Use a heat pack or a warm soak.  SEEK MEDICAL CARE IF:   · Symptoms get worse or do not improve in 2 weeks despite treatment.  · New, unexplained symptoms develop (drugs used in treatment may produce side effects).  EXERCISES  RANGE OF MOTION (ROM) AND STRETCHING EXERCISES - Cervical Strain and Sprain  These exercises may help you when beginning to rehabilitate your injury. In order to successfully resolve your symptoms, you must improve your posture. These exercises are designed to help reduce the forward-head and rounded-shoulder posture which contributes to this condition. Your symptoms may resolve with or without further involvement from your physician, physical therapist or athletic trainer. While completing these exercises, remember:   · Restoring tissue flexibility helps normal motion to return to the joints. This allows healthier, less painful movement and activity.  · An effective stretch should be held for at least 20 seconds, although you may need to begin with shorter hold times for comfort.  · A stretch should never be painful. You should only feel a gentle lengthening or release in the stretched tissue.  STRETCH- Axial Extensors  · Lie on your back on the floor. You may bend your knees for comfort. Place a rolled-up hand towel or dish towel, about 2 inches in diameter, under the part of your head that makes contact with the floor.  · Gently tuck your chin, as if trying to make a "double chin," until you feel a gentle stretch at the base of your head.  · Hold __________ seconds.  Repeat __________ times. Complete this exercise __________ times per day.   STRETCH - Axial Extension   · Stand or sit on a firm surface. Assume a good posture: chest up, shoulders drawn back, abdominal muscles slightly tense, knees unlocked (if standing) and feet hip width apart.  · Slowly retract your chin so your head slides back  and your chin slightly lowers. Continue to look straight ahead.  · You should feel a gentle stretch in the back of your head. Be certain not to feel an aggressive stretch since this can cause headaches later.  · Hold for __________ seconds.  Repeat __________ times. Complete this exercise __________ times per day.  STRETCH - Cervical Side Bend   · Stand or sit on a firm surface. Assume a good posture: chest up, shoulders drawn back, abdominal muscles slightly tense, knees unlocked (if standing) and feet hip width apart.  · Without letting your nose or shoulders move, slowly tip your right / left ear to your shoulder until your feel a gentle stretch in the muscles on the opposite side of your neck.  · Hold __________ seconds.  Repeat __________ times. Complete this exercise __________ times per day.  STRETCH - Cervical Rotators   · Stand or sit on a firm surface. Assume a good posture: chest up, shoulders drawn back, abdominal muscles slightly tense, knees unlocked (if standing) and feet hip width apart.  · Keeping your eyes level with the ground, slowly turn your head until you feel a gentle stretch along   the back and opposite side of your neck.  · Hold __________ seconds.  Repeat __________ times. Complete this exercise __________ times per day.  RANGE OF MOTION - Neck Circles   · Stand or sit on a firm surface. Assume a good posture: chest up, shoulders drawn back, abdominal muscles slightly tense, knees unlocked (if standing) and feet hip width apart.  · Gently roll your head down and around from the back of one shoulder to the back of the other. The motion should never be forced or painful.  · Repeat the motion 10-20 times, or until you feel the neck muscles relax and loosen.  Repeat __________ times. Complete the exercise __________ times per day.  STRENGTHENING EXERCISES - Cervical Strain and Sprain  These exercises may help you when beginning to rehabilitate your injury. They may resolve your symptoms with or  without further involvement from your physician, physical therapist, or athletic trainer. While completing these exercises, remember:   · Muscles can gain both the endurance and the strength needed for everyday activities through controlled exercises.  · Complete these exercises as instructed by your physician, physical therapist, or athletic trainer. Progress the resistance and repetitions only as guided.  · You may experience muscle soreness or fatigue, but the pain or discomfort you are trying to eliminate should never worsen during these exercises. If this pain does worsen, stop and make certain you are following the directions exactly. If the pain is still present after adjustments, discontinue the exercise until you can discuss the trouble with your clinician.  STRENGTH - Cervical Flexors, Isometric  · Face a wall, standing about 6 inches away. Place a small pillow, a ball about 6-8 inches in diameter, or a folded towel between your forehead and the wall.  · Slightly tuck your chin and gently push your forehead into the soft object. Push only with mild to moderate intensity, building up tension gradually. Keep your jaw and forehead relaxed.  · Hold 10 to 20 seconds. Keep your breathing relaxed.  · Release the tension slowly. Relax your neck muscles completely before you start the next repetition.  Repeat __________ times. Complete this exercise __________ times per day.  STRENGTH- Cervical Lateral Flexors, Isometric   · Stand about 6 inches away from a wall. Place a small pillow, a ball about 6-8 inches in diameter, or a folded towel between the side of your head and the wall.  · Slightly tuck your chin and gently tilt your head into the soft object. Push only with mild to moderate intensity, building up tension gradually. Keep your jaw and forehead relaxed.  · Hold 10 to 20 seconds. Keep your breathing relaxed.  · Release the tension slowly. Relax your neck muscles completely before you start the next  repetition.  Repeat __________ times. Complete this exercise __________ times per day.  STRENGTH - Cervical Extensors, Isometric   · Stand about 6 inches away from a wall. Place a small pillow, a ball about 6-8 inches in diameter, or a folded towel between the back of your head and the wall.  · Slightly tuck your chin and gently tilt your head back into the soft object. Push only with mild to moderate intensity, building up tension gradually. Keep your jaw and forehead relaxed.  · Hold 10 to 20 seconds. Keep your breathing relaxed.  · Release the tension slowly. Relax your neck muscles completely before you start the next repetition.  Repeat __________ times. Complete this exercise __________ times per day.    All of your joints have less wear and tear when properly supported by a spine with good posture. This means you will experience a healthier, less painful body.  Correct posture must be practiced with all of your activities, especially prolonged sitting and standing. Correct posture is as important when doing repetitive low-stress activities (typing) as it is when doing a single heavy-load activity (lifting). PROLONGED STANDING WHILE SLIGHTLY LEANING FORWARD When completing a task that requires you to lean forward while standing in one  place for a long time, place either foot up on a stationary 2- to 4-inch high object to help maintain the best posture. When both feet are on the ground, the low back tends to lose its slight inward curve. If this curve flattens (or becomes too large), then the back and your other joints will experience too much stress, fatigue more quickly, and can cause pain.  RESTING POSITIONS Consider which positions are most painful for you when choosing a resting position. If you have pain with flexion-based activities (sitting, bending, stooping, squatting), choose a position that allows you to rest in a less flexed posture. You would want to avoid curling into a fetal position on your side. If your pain worsens with extension-based activities (prolonged standing, working overhead), avoid resting in an extended position such as sleeping on your stomach. Most people will find more comfort when they rest with their spine in a more neutral position, neither too rounded nor too arched. Lying on a non-sagging bed on your side with a pillow between your knees, or on your back with a pillow under your knees will often provide some relief. Keep in mind, being in any one position for a prolonged period of time, no matter how correct your posture, can still lead to stiffness. WALKING Walk with an upright posture. Your ears, shoulders, and hips should all line up. OFFICE WORK When working at a desk, create an environment that supports good, upright posture. Without extra support, muscles fatigue and lead to excessive strain on joints and other tissues. CHAIR:  A chair should be able to slide under your desk when your back makes contact with the back of the chair. This allows you to work closely.  The chair's height should allow your eyes to be level with the upper part of your monitor and your hands to be slightly lower than your elbows.  Body position:  Your feet should make contact with the floor. If this is not  possible, use a foot rest.  Keep your ears over your shoulders. This will reduce stress on your neck and low back.   This information is not intended to replace advice given to you by your health care provider. Make sure you discuss any questions you have with your health care provider.   Document Released: 09/14/2005 Document Revised: 10/05/2014 Document Reviewed: 12/27/2008 [employer] Interactive Patient Education 2016 [employer]  Chest Wall Pain Chest wall pain is pain in or around the bones and muscles of your chest. Sometimes, an injury causes this pain. Sometimes, the cause may not be known. This pain may take several weeks or longer to get better. HOME CARE INSTRUCTIONS  Pay attention to any changes in your symptoms. Take these actions to help with your pain:   Rest as told by your health care provider.   Avoid activities that cause pain. These include any activities that use your chest muscles or your abdominal and side muscles to lift heavy items.   If directed, apply  ice to the painful area:  Put ice in a plastic bag.  Place a towel between your skin and the bag.  Leave the ice on for 20 minutes, 2-3 times per day.  Take over-the-counter and prescription medicines only as told by your health care provider.  Do not use tobacco products, including cigarettes, chewing tobacco, and e-cigarettes. If you need help quitting, ask your health care provider.  Keep all follow-up visits as told by your health care provider. This is important. SEEK MEDICAL CARE IF:  You have a fever.  Your chest pain becomes worse.  You have new symptoms. SEEK IMMEDIATE MEDICAL CARE IF:  You have nausea or vomiting.  You feel sweaty or light-headed.  You have a cough with phlegm (sputum) or you cough up blood.  You develop shortness of breath.   This information is not intended to replace advice given to you by your health care provider. Make sure you discuss any questions you have  with your health care provider.   Document Released: 09/14/2005 Document Revised: 06/05/2015 Document Reviewed: 12/10/2014 [employer] Interactive Patient Education 2016 [employer].  Mid-Back Strain With Rehab  A strain is an injury in which a tendon or muscle is torn. The muscles and tendons of the mid-back are vulnerable to strains. However, these muscles and tendons are very strong and require a great force to be injured. The muscles of the mid-back are responsible for stabilizing the spinal column, as well as spinal twisting (rotation). Strains are classified into three categories. Grade 1 strains cause pain, but the tendon is not lengthened. Grade 2 strains include a lengthened ligament, due to the ligament being stretched or partially ruptured. With grade 2 strains there is still function, although the function may be decreased. Grade 3 strains involve a complete tear of the tendon or muscle, and function is usually impaired. SYMPTOMS   Pain in the middle of the back.  Pain that may affect only one side, and is worse with movement.  Muscle spasms, and often swelling in the back.  Loss of strength of the back muscles.  Crackling sound (crepitation) when the muscles are touched. CAUSES  Mid-back strains occur when a force is placed on the muscles or tendons that is greater than they can handle. Common causes of injury include:  Ongoing overuse of the muscle-tendon units in the middle back, usually from incorrect body posture.  A single violent injury or force applied to the back. RISK INCREASES WITH:  Sports that involve twisting forces on the spine or a lot of bending at the waist (football, rugby, weightlifting, bowling, golf, tennis, speed skating, racquetball, swimming, running, gymnastics, diving).  Poor strength and flexibility.  Failure to warm up properly before activity.  Family history of low back pain or disk disorders.  Previous back injury or surgery (especially  fusion). PREVENTION  Learn and use proper sports technique.  Warm up and stretch properly before activity.  Allow for adequate recovery between workouts.  Maintain physical fitness:  Strength, flexibility, and endurance.  Cardiovascular fitness. PROGNOSIS  If treated properly, mid-back strains usually heal within 6 weeks. RELATED COMPLICATIONS   Frequently recurring symptoms, resulting in a chronic problem. Properly treating the problem the first time decreases frequency of recurrence.  Chronic inflammation, scarring, and partial muscle-tendon tear.  Delayed healing or resolution of symptoms, especially if activity is resumed too soon.  Prolonged disability. TREATMENT Treatment first involves the use of ice and medicine, to reduce pain and inflammation. As the pain  begins to subside, you may begin strengthening and stretching exercises to improve body posture and sport technique. These exercises may be performed at home or with a therapist. Severe injuries may require referral to a therapist for further evaluation and treatment, such as ultrasound. Corticosteroid injections may be given to help reduce inflammation. Biofeedback (watching monitors of your body processes) and psychotherapy may also be prescribed. Prolonged bed rest is felt to do more harm than good. Massage may help break the muscle spasms. Sometimes, an injection of cortisone, with or without local anesthetics, may be given to help relieve the pain and spasms. MEDICATION   If pain medicine is needed, nonsteroidal anti-inflammatory medicines (aspirin and ibuprofen), or other minor pain relievers (acetaminophen), are often advised.  Do not take pain medicine for 7 days before surgery.  Prescription pain relievers may be given, if your caregiver thinks they are needed. Use only as directed and only as much as you need.  Ointments applied to the skin may be helpful.  Corticosteroid injections may be given by your  caregiver. These injections should be reserved for the most serious cases, because they may only be given a certain number of times. HEAT AND COLD:   Cold treatment (icing) should be applied for 10 to 15 minutes every 2 to 3 hours for inflammation and pain, and immediately after activity that aggravates your symptoms. Use ice packs or an ice massage.  Heat treatment may be used before performing stretching and strengthening activities prescribed by your caregiver, physical therapist, or athletic trainer. Use a heat pack or a warm water soak. SEEK IMMEDIATE MEDICAL CARE IF:  Symptoms get worse or do not improve in 2 to 4 weeks, despite treatment.  You develop numbness, weakness, or loss of bowel or bladder function.  New, unexplained symptoms develop. (Drugs used in treatment may produce side effects.) EXERCISES RANGE OF MOTION (ROM) AND STRETCHING EXERCISES - Mid-Back Strain These exercises may help you when beginning to rehabilitate your injury. In order to successfully resolve your symptoms, you must improve your posture. These exercises are designed to help reduce the forward-head and rounded-shoulder posture which contributes to this condition. Your symptoms may resolve with or without further involvement from your physician, physical therapist or athletic trainer. While completing these exercises, remember:   Restoring tissue flexibility helps normal motion to return to the joints. This allows healthier, less painful movement and activity.  An effective stretch should be held for at least 30 seconds.  A stretch should never be painful. You should only feel a gentle lengthening or release in the stretched tissue. STRETCH - Axial Extension  Stand or sit on a firm surface. Assume a good posture: chest up, shoulders drawn back, stomach muscles slightly tense, knees unlocked (if standing) and feet hip width apart.  Slowly retract your chin, so your head slides back and your chin slightly  lowers. Continue to look straight ahead.  You should feel a gentle stretch in the back of your head. Be certain not to feel an aggressive stretch since this can cause headaches later.  Hold for __________ seconds. Repeat __________ times. Complete this exercise __________ times per day. RANGE OF MOTION- Upper Thoracic Extension  Sit on a firm chair with a high back. Assume a good posture: chest up, shoulders drawn back, abdominal muscles slightly tense, and feet hip width apart. Place a small pillow or folded towel in the curve of your lower back, if you are having difficulty maintaining good posture.  Gently brace  your neck with your hands, allowing your arms to rest on your chest.  Continue to support your neck and slowly extend your back over the chair. You will feel a stretch across your upper back.  Hold __________ seconds. Slowly return to the starting position. Repeat __________ times. Complete this exercise __________ times per day. RANGE OF MOTION- Mid-Thoracic Extension  Roll a towel so that it is about 4 inches in diameter.  Position the towel lengthwise. Lay on the towel so that your spine, but not your shoulder blades, are supported.  You should feel your mid-back arching toward the floor. To increase the stretch, extend your arms away from your body.  Hold for __________ seconds. Repeat exercise __________ times, __________ times per day. STRENGTHENING EXERCISES - Mid-Back Strain These exercises may help you when beginning to rehabilitate your injury. They may resolve your symptoms with or without further involvement from your physician, physical therapist or athletic trainer. While completing these exercises, remember:   Muscles can gain both the endurance and the strength needed for everyday activities through controlled exercises.  Complete these exercises as instructed by your physician, physical therapist or athletic trainer. Increase the resistance and repetitions  only as guided by your caregiver.  You may experience muscle soreness or fatigue, but the pain or discomfort you are trying to eliminate should never worsen during these exercises. If this pain does worsen, stop and make certain you are following the directions exactly. If the pain is still present after adjustments, discontinue the exercise until you can discuss the trouble with your caregiver. STRENGTHENING - Quadruped, Opposite UE/LE Lift  Assume a hands and knees position on a firm surface. Keep your hands under your shoulders and your knees under your hips. You may place padding under your knees for comfort.  Find your neutral spine and gently tense your abdominal muscles so that you can maintain this position. Your shoulders and hips should form a rectangle that is parallel with the floor and is not twisted.  Keeping your trunk steady, lift your right hand no higher than your shoulder and then your left leg no higher than your hip. Make sure you are not holding your breath. Hold this position __________ seconds.  Continuing to keep your abdominal muscles tense and your back steady, slowly return to your starting position. Repeat with the opposite arm and leg. Repeat __________ times. Complete this exercise __________ times per day.  STRENGTH - Shoulder Extensors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

## 2017-05-12 ENCOUNTER — Encounter (HOSPITAL_COMMUNITY): Payer: Self-pay | Admitting: Family Medicine

## 2017-05-12 ENCOUNTER — Emergency Department (HOSPITAL_COMMUNITY)
Admission: EM | Admit: 2017-05-12 | Discharge: 2017-05-12 | Disposition: A | Discharge status: Home / Self Care | Payer: Medicaid Other | Attending: Emergency Medicine | Admitting: Emergency Medicine

## 2017-05-12 DIAGNOSIS — F17210 Nicotine dependence, cigarettes, uncomplicated: Secondary | ICD-10-CM | POA: Insufficient documentation

## 2017-05-12 DIAGNOSIS — J45909 Unspecified asthma, uncomplicated: Secondary | ICD-10-CM | POA: Diagnosis not present

## 2017-05-12 DIAGNOSIS — R197 Diarrhea, unspecified: Secondary | ICD-10-CM | POA: Insufficient documentation

## 2017-05-12 DIAGNOSIS — Z79899 Other long term (current) drug therapy: Secondary | ICD-10-CM | POA: Diagnosis not present

## 2017-05-12 DIAGNOSIS — J029 Acute pharyngitis, unspecified: Secondary | ICD-10-CM | POA: Diagnosis present

## 2017-05-12 LAB — RAPID STREP SCREEN (MED CTR MEBANE ONLY): Streptococcus, Group A Screen (Direct): NEGATIVE

## 2017-05-12 MED ORDER — PENICILLIN G BENZATHINE 1200000 UNIT/2ML IM SUSP
1.2000 10*6.[IU] | Freq: Once | INTRAMUSCULAR | Status: AC
  Administered 2017-05-12: 1.2 10*6.[IU] via INTRAMUSCULAR
  Filled 2017-05-12: qty 2

## 2017-05-12 MED ORDER — IBUPROFEN 100 MG/5ML PO SUSP
600.0000 mg | Freq: Once | ORAL | Status: AC
  Administered 2017-05-12: 600 mg via ORAL
  Filled 2017-05-12: qty 30

## 2017-05-12 MED ORDER — OXYCODONE-ACETAMINOPHEN 5-325 MG PO TABS
1.0000 | ORAL_TABLET | Freq: Once | ORAL | Status: AC
  Administered 2017-05-12: 1 via ORAL
  Filled 2017-05-12: qty 1

## 2017-05-12 MED ORDER — IBUPROFEN 100 MG/5ML PO SUSP: 200.0000 mg | Freq: Four times a day (QID) | ORAL | 0 refills | Status: DC

## 2017-05-12 MED ORDER — DEXAMETHASONE SODIUM PHOSPHATE 10 MG/ML IJ SOLN
10.0000 mg | Freq: Once | INTRAMUSCULAR | Status: AC
  Administered 2017-05-12: 10 mg via INTRAMUSCULAR
  Filled 2017-05-12: qty 1
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
## 2017-05-12 NOTE — ED Provider Notes (Signed)
[facility] DEPT Provider Note   CSN: [id number] Arrival date & time: 05/12/17  1813     History   Chief Complaint Chief Complaint  Patient presents with  . Sore Throat    HPI ICHAEL Strong is a 31 y.o. male.  HPI  31 y.o. male with hx of asthma presents to the Emergency Department today via EMS due to sore throat that started 5 days ago. Patient rates the pain as a 10/10 and states "it feels like razor blades when I swallow." Patient is spitting into a bag because it hurts to swallow, but is able to still tolerate secretions. Notes right ear pain w/o hearing loss, mild congestion, swollen lymph node. Patient denies any N/V, fever, cough, abdominal pain, or chest pain. Attempted OTC remedies with minimal relief. No other symptoms noted.   Past Medical History:  Diagnosis Date  . Asthma   . Fibromyalgia   . Rhabdomyoma     Patient Active Problem List   Diagnosis Date Noted  . 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 EYE SURGERY         Home Medications    Prior to Admission medications   Medication Sig Start Date End Date Taking? Authorizing Provider  acetaminophen (TYLENOL) 500 MG tablet Take 1,000 mg by mouth every 6 (six) hours as needed for moderate pain.    [provider]  albuterol (PROVENTIL HFA;VENTOLIN HFA) 108 (90 BASE) MCG/ACT inhaler Inhale 1-2 puffs into the lungs every 6 (six) hours as needed for wheezing or shortness of breath. 01/26/15   Linton Flemings, MD  amitriptyline (ELAVIL) 25 MG tablet Take 25 mg by mouth at bedtime.    [provider]  amitriptyline (ELAVIL) 25 MG tablet Take 1 tablet (25 mg total) by mouth at bedtime. 01/26/15   Linton Flemings, MD  benzonatate (TESSALON) 100 MG capsule Take 1 capsule (100 mg total) by mouth every 8 (eight) hours. 01/26/15   Linton Flemings, MD  DULoxetine (CYMBALTA) 60 MG capsule Take 60 mg by mouth daily.    [provider]  DULoxetine (CYMBALTA)  60 MG capsule Take 1 capsule (60 mg total) by mouth daily. 01/26/15   Linton Flemings, MD  guaiFENesin (ROBITUSSIN) 100 MG/5ML liquid Take 5-10 mLs (100-200 mg total) by mouth every 4 (four) hours as needed for cough. 01/26/15   Linton Flemings, MD  HYDROcodone-acetaminophen (NORCO/VICODIN) 5-325 MG tablet Take 1 tablet by mouth every 4 (four) hours as needed. 04/15/16   Janne Napoleon, NP  ibuprofen (ADVIL,MOTRIN) 800 MG tablet Take 800 mg by mouth every 8 (eight) hours as needed for moderate pain.    [provider]  naproxen (NAPROSYN) 375 MG tablet Take 1 tablet (375 mg total) by mouth 2 (two) times daily. 04/15/16   Janne Napoleon, NP  traZODone (DESYREL) 100 MG tablet Take 100 mg by mouth at bedtime.    [provider]    Family History History reviewed. No pertinent family history.  Social History Social History  Substance Use Topics  . Smoking status: Current Every Day Smoker    Packs/day: 0.50  . Smokeless tobacco: Never Used  . Alcohol use No     Allergies   Patient has no known allergies.   Review of Systems Review of Systems  Constitutional: Negative for fever.  HENT: Positive for sore throat.   Respiratory: Negative for shortness of breath.   Gastrointestinal: Negative for nausea and vomiting.  Musculoskeletal: Negative for neck  pain and neck stiffness.  Skin: Negative for wound.   Physical Exam Updated Vital Signs BP 113/82 (BP Location: Left Arm)   Pulse 80   Temp 99.9 F (37.7 C) (Oral)   Resp 20   Ht 5\' 6"  (1.676 m)   Wt 59 kg (130 lb)   SpO2 99%   BMI 20.98 kg/m   Physical Exam  Constitutional: He is oriented to person, place, and time. He appears well-developed and well-nourished. No distress.  HENT:  Head: Normocephalic and atraumatic.  Right Ear: Tympanic membrane, external ear and ear canal normal.  Left Ear: Tympanic membrane, external ear and ear canal normal.  Nose: Nose normal.  Mouth/Throat: Uvula is midline and mucous membranes are  normal. No trismus in the jaw. Normal dentition. No dental abscesses or uvula swelling. Oropharyngeal exudate and posterior oropharyngeal erythema present. No tonsillar abscesses.  Posterior oropharynx erythematous. Exudate. Uvula midline. No trismus. Full ROM of neck without pain.   Eyes: Pupils are equal, round, and reactive to light. EOM are normal.  Neck: Normal range of motion. Neck supple. No tracheal deviation present.  Cardiovascular: Normal rate, regular rhythm, S1 normal, S2 normal, normal heart sounds, intact distal pulses and normal pulses.   Pulmonary/Chest: Effort normal and breath sounds normal. No respiratory distress. He has no decreased breath sounds. He has no wheezes. He has no rhonchi. He has no rales.  Abdominal: Normal appearance and bowel sounds are normal. There is no tenderness.  Musculoskeletal: Normal range of motion.  Neurological: He is alert and oriented to person, place, and time.  Skin: Skin is warm and dry.  Psychiatric: He has a normal mood and affect. His speech is normal and behavior is normal. Thought content normal.     ED Treatments / Results  Labs (all labs ordered are listed, but only abnormal results are displayed) Labs Reviewed  RAPID STREP SCREEN (NOT AT [facility])  CULTURE, GROUP A STREP [facility])    EKG  EKG Interpretation None       Radiology No results found.  Procedures Procedures (including critical care time)  Medications Ordered in ED Medications  dexamethasone (DECADRON) injection 10 mg (not administered)  penicillin g benzathine (BICILLIN LA) 1200000 UNIT/2ML injection 1.2 Million Units (not administered)  oxyCODONE-acetaminophen (PERCOCET/ROXICET) 5-325 MG per tablet 1 tablet (not administered)  ibuprofen (ADVIL,MOTRIN) 100 MG/5ML suspension 600 mg (not administered)     Initial Impression / Assessment and Plan / ED Course  I have reviewed the triage vital signs and the nursing notes.  Pertinent labs & imaging results that  were available during my care of the patient were reviewed by me and considered in my medical decision making (see chart for details).  Final Clinical Impressions(s) / ED Diagnoses  {I have reviewed and evaluated the relevant laboratory values.   {I have reviewed the relevant previous healthcare records.  {I obtained HPI from historian.   ED Course:  Assessment: Pt is a 31 y.o. male with hx of asthma presents to the Emergency Department today via EMS due to sore throat that started 5 days ago. Patient rates the pain as a 10/10 and states "it feels like razor blades when I swallow." Patient is spitting into a bag because it hurts to swallow, but is able to still tolerate secretions. Notes right ear pain w/o hearing loss, mild congestion, swollen lymph node. Patient denies any N/V, fever, cough, abdominal pain, or chest pain. Attempted OTC remedies with minimal relief. On exam, pt in NAD. Nontoxic/nonseptic appearing.  VSS. Afebrile. Lungs CTA. Heart RRR. Low indication for RTA or PTA given exam. Rapid strep negative .Given exam, will tx with Penicillin and Decadron. Culture pending. Close follow up. Strict return precautions noted. Plan is to [place] with follow up to PCP. At time of discharge, Patient is in no acute distress. Vital Signs are stable. Patient is able to ambulate. Patient able to tolerate PO.    Disposition/Plan:  DC Home Additional Verbal discharge instructions given and discussed with patient.  Pt Instructed to f/u with PCP in the next week for evaluation and treatment of symptoms. Return precautions given Pt acknowledges and agrees with plan  Supervising Physician Gareth Morgan, MD  Final diagnoses:  Pharyngitis, unspecified etiology    New Prescriptions New Prescriptions   No medications on file     Conni Slipper 05/12/17 Ernesto Rutherford, MD 05/13/17 [phone]

## 2017-05-12 NOTE — ED Triage Notes (Signed)
Patient is from home and complaining of sore throat and diarrhea. Sore throat symptoms started 5 days ago and feels like is swallowing razor blades. Patient is ambulatory from EMS bay to triage. Appears in no acute distress. Denies any nausea or vomiting. EMS reports his lymph nodes are swollen and inflamed.

## 2017-05-15 LAB — CULTURE, GROUP A STREP (THRC)

## 2017-08-27 ENCOUNTER — Emergency Department (HOSPITAL_COMMUNITY)
Admission: EM | Admit: 2017-08-27 | Discharge: 2017-08-27 | Disposition: A | Discharge status: Home / Self Care | Payer: Medicaid Other | Attending: Emergency Medicine | Admitting: Emergency Medicine

## 2017-08-27 ENCOUNTER — Encounter (HOSPITAL_COMMUNITY): Payer: Self-pay | Admitting: *Deleted

## 2017-08-27 DIAGNOSIS — R51 Headache: Secondary | ICD-10-CM | POA: Insufficient documentation

## 2017-08-27 DIAGNOSIS — Z5321 Procedure and treatment not carried out due to patient leaving prior to being seen by health care provider: Secondary | ICD-10-CM | POA: Diagnosis not present

## 2017-08-27 NOTE — ED Notes (Signed)
No answer for vital signs recheck 

## 2017-08-27 NOTE — ED Notes (Signed)
No answer x3

## 2017-08-27 NOTE — ED Triage Notes (Signed)
Pt reports left side headache x 2 weeks. No relief with otc. Has sensitivity to light. Denies n/v.

## 2017-08-27 NOTE — ED Notes (Signed)
[facility] staff unable to locate pt for vitals reassessment.

## 2018-05-31 ENCOUNTER — Ambulatory Visit (HOSPITAL_COMMUNITY)
Admission: EM | Admit: 2018-05-31 | Discharge: 2018-05-31 | Disposition: A | Discharge status: Home / Self Care | Payer: Medicaid Other | Attending: Family Medicine | Admitting: Family Medicine

## 2018-05-31 ENCOUNTER — Ambulatory Visit (INDEPENDENT_AMBULATORY_CARE_PROVIDER_SITE_OTHER): Payer: Medicaid Other

## 2018-05-31 ENCOUNTER — Encounter (HOSPITAL_COMMUNITY): Payer: Self-pay

## 2018-05-31 ENCOUNTER — Ambulatory Visit (HOSPITAL_COMMUNITY): Payer: Medicaid Other

## 2018-05-31 DIAGNOSIS — M436 Torticollis: Secondary | ICD-10-CM

## 2018-05-31 MED ORDER — CYCLOBENZAPRINE HCL 10 MG PO TABS: 10.0000 mg | ORAL_TABLET | Freq: Two times a day (BID) | ORAL | 0 refills | Status: DC | PRN

## 2018-05-31 MED ORDER — KETOROLAC TROMETHAMINE 60 MG/2ML IM SOLN
INTRAMUSCULAR | Status: AC
  Filled 2018-05-31: qty 2

## 2018-05-31 MED ORDER — NAPROXEN 500 MG PO TABS: 500.0000 mg | ORAL_TABLET | Freq: Two times a day (BID) | ORAL | 0 refills | Status: DC

## 2018-05-31 MED ORDER — KETOROLAC TROMETHAMINE 60 MG/2ML IM SOLN
60.0000 mg | Freq: Once | INTRAMUSCULAR | Status: AC
Start: 2018-05-31 — End: 2018-05-31
  Administered 2018-05-31: 60 mg via INTRAMUSCULAR

## 2018-05-31 NOTE — ED Provider Notes (Signed)
[place]    CSN: [id number] Arrival date & time: 05/31/18  7494     History   Chief Complaint Chief Complaint  Patient presents with  . Neck Pain    HPI Kirk Strong is a 32 y.o. male history of fibromyalgia, asthma presenting today for evaluation of neck pain.  Patient states that this morning when he woke up his neck felt fine, but he stretched with his arms upward and bent his neck backwards and all of a sudden felt a pop.  Since he has had significant pain with moving his head towards the right side of his body.  He is been mainly looking over his left shoulder.  Denies previous issues with his neck.  Points to post auricular/upper SCM area with some tingling.  Duration of pain into his upper extremities or numbness or tingling.  Denies changes in vision.  Describes his pain as a tight sensation.  HPI  Past Medical History:  Diagnosis Date  . Asthma   . Fibromyalgia   . Rhabdomyoma     Patient Active Problem List   Diagnosis Date Noted  . 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 EYE SURGERY         Home Medications    Prior to Admission medications   Medication Sig Start Date End Date Taking? Authorizing Provider  acetaminophen (TYLENOL) 500 MG tablet Take 1,000 mg by mouth every 6 (six) hours as needed for moderate pain.    [provider]  albuterol (PROVENTIL HFA;VENTOLIN HFA) 108 (90 BASE) MCG/ACT inhaler Inhale 1-2 puffs into the lungs every 6 (six) hours as needed for wheezing or shortness of breath. 01/26/15   Linton Flemings, MD  cyclobenzaprine (FLEXERIL) 10 MG tablet Take 1 tablet (10 mg total) by mouth 2 (two) times daily as needed for muscle spasms. 05/31/18   Ashad Fawbush C, PA-C  naproxen (NAPROSYN) 500 MG tablet Take 1 tablet (500 mg total) by mouth 2 (two) times daily. 05/31/18   Elgar Scoggins C, PA-C  traZODone (DESYREL) 100 MG tablet Take 100 mg by mouth at bedtime.    [provider]    Family History History reviewed. No pertinent family history.  Social History Social History   Tobacco Use  . Smoking status: Current Every Day Smoker    Packs/day: 0.50  . Smokeless tobacco: Never Used  Substance Use Topics  . Alcohol use: No  . Drug use: Yes    Types: Marijuana     Allergies   Patient has no known allergies.   Review of Systems Review of Systems  Constitutional: Negative for fatigue and fever.  HENT: Negative for congestion.   Eyes: Negative for photophobia, pain and visual disturbance.  Respiratory: Negative for cough and shortness of breath.   Cardiovascular: Negative for chest pain.  Gastrointestinal: Negative for abdominal pain, nausea and vomiting.  Musculoskeletal: Positive for myalgias, neck pain and neck stiffness. Negative for arthralgias and back pain.  Skin: Negative for color change and wound.  Neurological: Negative for dizziness, facial asymmetry, weakness, light-headedness, numbness and headaches.     Physical Exam Triage Vital Signs ED Triage Vitals [05/31/18 0928]  Enc Vitals Group     BP 121/80     Pulse Rate 70     Resp 18     Temp 98.1 F (36.7 C)     Temp Source Oral     SpO2 99 %  Weight      Height      Head Circumference      Peak Flow      Pain Score      Pain Loc      Pain Edu?      Excl. in [place]?    No data found.  Updated Vital Signs BP 121/80 (BP Location: Left Arm)   Pulse 70   Temp 98.1 F (36.7 C) (Oral)   Resp 18   SpO2 99%   Visual Acuity Right Eye Distance:   Left Eye Distance:   Bilateral Distance:    Right Eye Near:   Left Eye Near:    Bilateral Near:     Physical Exam  Constitutional: He is oriented to person, place, and time. He appears well-developed and well-nourished.  Mild distress due to pain  HENT:  Head: Normocephalic and atraumatic.  Mouth/Throat: Oropharynx is clear and moist.  Eyes: Pupils are equal, round, and reactive to light. Conjunctivae and  EOM are normal.  Neck: Neck supple.  Cardiovascular: Normal rate and regular rhythm.  No murmur heard. Pulmonary/Chest: Effort normal and breath sounds normal. No respiratory distress.  Abdominal: Soft. There is no tenderness.  Musculoskeletal: He exhibits no edema.  Patient resting with his head turned leftward, avoiding any movements of his neck, significant tenderness to palpation along his sternocleidomastoid as well as trapezius musculature on the right side, nontender on left.  Full active range of motion of shoulders.  Neurological: He is alert and oriented to person, place, and time.  Skin: Skin is warm and dry.  Psychiatric: He has a normal mood and affect.  Nursing note and vitals reviewed.    UC Treatments / Results  Labs (all labs ordered are listed, but only abnormal results are displayed) Labs Reviewed - No data to display  EKG None  Radiology Dg Cervical Spine Complete  Result Date: 05/31/2018 CLINICAL DATA:  pop in the right side of his neck, patient cannot move his neck or straighten his neck to the right side, neck is bent to the left side. Pain is a 10 EXAM: CERVICAL SPINE - COMPLETE 4+ VIEW COMPARISON:  None. FINDINGS: Mild dextroscoliosis of the cervical spine without underlying vertebral anomaly. No perched facet. Vertebral body and disc height maintained throughout. No prevertebral soft tissue swelling. Negative for fracture. No definite osseous degenerative change. Multiple dental restorations. IMPRESSION: Torticollis without bone abnormality. Electronically Signed   By: Lucrezia Europe M.D.   On: 05/31/2018 10:20    Procedures Procedures (including critical care time)  Medications Ordered in UC Medications  ketorolac (TORADOL) injection 60 mg (60 mg Intramuscular Given 05/31/18 1031)    Initial Impression / Assessment and Plan / UC Course  I have reviewed the triage vital signs and the nursing notes.  Pertinent labs & imaging results that were available during  my care of the patient were reviewed by me and considered in my medical decision making (see chart for details).     X-rays show no bony abnormality, symptoms most likely muscular, strain versus spasm.  Patient with torticollis.  Will provide Toradol in clinic today, will send home with soft neck collar.  Anti-inflammatories and muscle relaxer.  Discussed sedation with Flexeril and advised not to use at work for while driving.  Gentle neck exercises as pain improving.  Ice and heat.  Discussed strict return precautions. Patient verbalized understanding and is agreeable with plan.  Final Clinical Impressions(s) / UC Diagnoses   Final diagnoses:  Torticollis, acute     Discharge Instructions     We gave you a hot of toradol today  Use anti-inflammatories for pain/swelling. You may take up to 800 mg Ibuprofen every 8 hours OR naprosyn twice daily with food. You may supplement Ibuprofen with Tylenol [phone] mg every 8 hours.   Heating pad  Slowly work on stretching neck/returning neck to normal position  You may use flexeril as needed to help with pain. This is a muscle relaxer and causes sedation- please use only at bedtime or when you will be home and not have to drive/work  Please return if developing worsening pain, pain not improving in the next 1 to 2 weeks, developing numbness or tingling extending into the arms, change in vision, dizziness lightheadedness   ED Prescriptions    Medication Sig Dispense Auth. Provider   naproxen (NAPROSYN) 500 MG tablet Take 1 tablet (500 mg total) by mouth 2 (two) times daily. 30 tablet Sophia Cubero C, PA-C   cyclobenzaprine (FLEXERIL) 10 MG tablet Take 1 tablet (10 mg total) by mouth 2 (two) times daily as needed for muscle spasms. 20 tablet Shaeley Segall, [place] C, PA-C     Controlled Substance Prescriptions [place] Controlled Substance Registry consulted? Not Applicable   Janith Lima, [place] 05/31/18 1058

## 2018-05-31 NOTE — ED Triage Notes (Signed)
Pt states he wake up and stood up and stretched and all of a sudden his neck popped.

## 2018-05-31 NOTE — Discharge Instructions (Addendum)
We gave you a hot of toradol today  Use anti-inflammatories for pain/swelling. You may take up to 800 mg Ibuprofen every 8 hours OR naprosyn twice daily with food. You may supplement Ibuprofen with Tylenol [id number] mg every 8 hours.   Heating pad  Slowly work on stretching neck/returning neck to normal position  You may use flexeril as needed to help with pain. This is a muscle relaxer and causes sedation- please use only at bedtime or when you will be home and not have to drive/work  Please return if developing worsening pain, pain not improving in the next 1 to 2 weeks, developing numbness or tingling extending into the arms, change in vision, dizziness lightheadedness

## 2018-12-26 ENCOUNTER — Encounter (HOSPITAL_COMMUNITY): Payer: Self-pay | Admitting: Emergency Medicine

## 2018-12-26 ENCOUNTER — Ambulatory Visit (HOSPITAL_COMMUNITY)
Admission: EM | Admit: 2018-12-26 | Discharge: 2018-12-26 | Disposition: A | Discharge status: Home / Self Care | Payer: Medicaid Other | Attending: Family Medicine | Admitting: Family Medicine

## 2018-12-26 ENCOUNTER — Other Ambulatory Visit: Payer: Self-pay

## 2018-12-26 DIAGNOSIS — G44209 Tension-type headache, unspecified, not intractable: Secondary | ICD-10-CM | POA: Diagnosis not present

## 2018-12-26 NOTE — ED Provider Notes (Signed)
[place]   [id number] 12/26/18 Arrival Time: 1034  ASSESSMENT & PLAN:  1. Acute non intractable tension-type headache    Likely tension-type. Has resolved and not returned. Work note provided.  Reviewed expectations re: course of current medical issues. Questions answered. Outlined signs and symptoms indicating need for more acute intervention. Patient verbalized understanding. After Visit Summary given.   SUBJECTIVE:  Kirk Strong is a 32 y.o. male who reports a moderate headache last week causing her to miss work. Occasional and similar headaches. Describes generalized "throbbing" of her head without aure, n/v, photophobia. No extremity sensation changes or weakness. No specific aggravating or alleviating factors reported. Headache has now resolved. She is requesting a work note so that she may return to work. Tylenol usually helps with headaches. No head injury reported. Ambulatory without difficulty.  ROS: As per HPI. All other systems negative.    OBJECTIVE:  Vitals:   12/26/18 1048  BP: (!) 149/90  Pulse: 89  Resp: 18  Temp: 98.1 F (36.7 C)  TempSrc: Oral  SpO2: 98%    General appearance: alert; no distress Eyes: PERRLA; EOMI; conjunctiva normal HENT: normocephalic; atraumatic Neck: supple with FROM Lungs: clear to auscultation bilaterally Heart: regular rate and rhythm Extremities: no edema; symmetrical with no gross deformities Skin: warm and dry Neurologic: CN 2-12 grossly intact; normal gait; normal symmetric reflexes; normal extremity strength and sensation throughout Psychological: alert and cooperative; normal mood and affect  No Known Allergies  Past Medical History:  Diagnosis Date  . Asthma   . Fibromyalgia   . Rhabdomyoma    Social History   Socioeconomic History  . Marital status: Single    Spouse name: Not on file  . Number of children: Not on file  . Years of education: Not on file  . Highest education level: Not  on file  Occupational History  . Not on file  Social Needs  . Financial resource strain: Not on file  . Food insecurity:    Worry: Not on file    Inability: Not on file  . Transportation needs:    Medical: Not on file    Non-medical: Not on file  Tobacco Use  . Smoking status: Current Every Day Smoker    Packs/day: 0.50  . Smokeless tobacco: Never Used  Substance and Sexual Activity  . Alcohol use: No  . Drug use: Yes    Types: Marijuana  . Sexual activity: Never  Lifestyle  . Physical activity:    Days per week: Not on file    Minutes per session: Not on file  . Stress: Not on file  Relationships  . Social connections:    Talks on phone: Not on file    Gets together: Not on file    Attends religious service: Not on file    Active member of club or organization: Not on file    Attends meetings of clubs or organizations: Not on file    Relationship status: Not on file  . Intimate partner violence:    Fear of current or ex partner: Not on file    Emotionally abused: Not on file    Physically abused: Not on file    Forced sexual activity: Not on file  Other Topics Concern  . Not on file  Social History Narrative  . Not on file    Past Surgical History:  Procedure Laterality Date  . EYE SURGERY       Vanessa Kick, MD 12/29/18 [phone]

## 2018-12-26 NOTE — ED Triage Notes (Signed)
Pt here for letter for work stating no flu sx

## 2018-12-26 NOTE — ED Notes (Signed)
Patient verbalizes understanding of discharge instructions. Opportunity for questioning and answers were provided. Patient discharged from [facility] by MD. 

## 2019-03-02 IMAGING — DX DG CERVICAL SPINE COMPLETE 4+V
4 series · 4 of 4 positions shown · non-contrast
Comparison: None.

CLINICAL DATA: pop in the right side of his neck, patient cannot
move his neck or straighten his neck to the right side, neck is bent
to the left side. Pain is a 10

EXAM:
CERVICAL SPINE - COMPLETE 4+ VIEW

[c-spine lat]
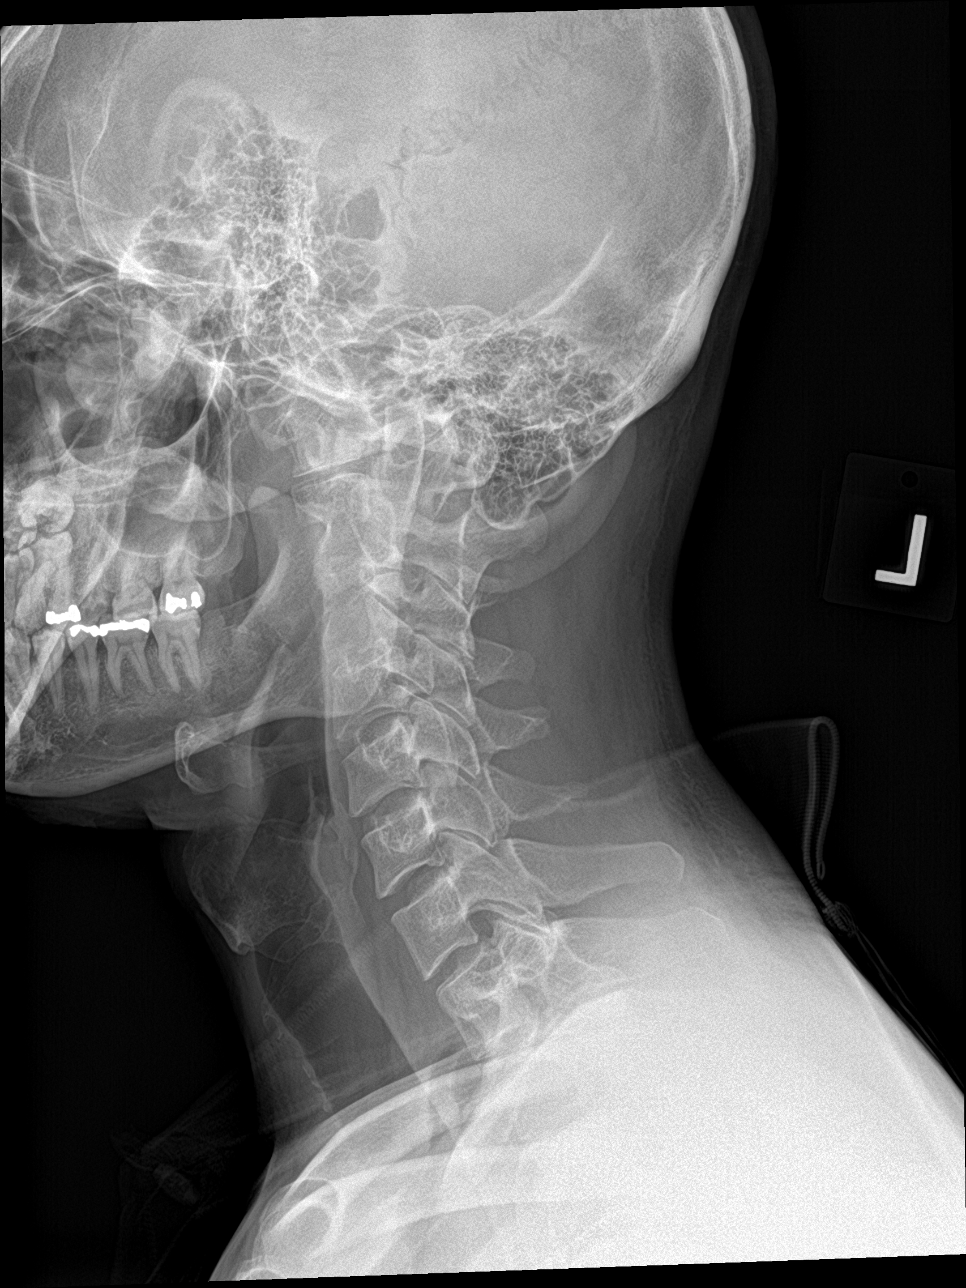

[c-spine obl (1 of 2)]
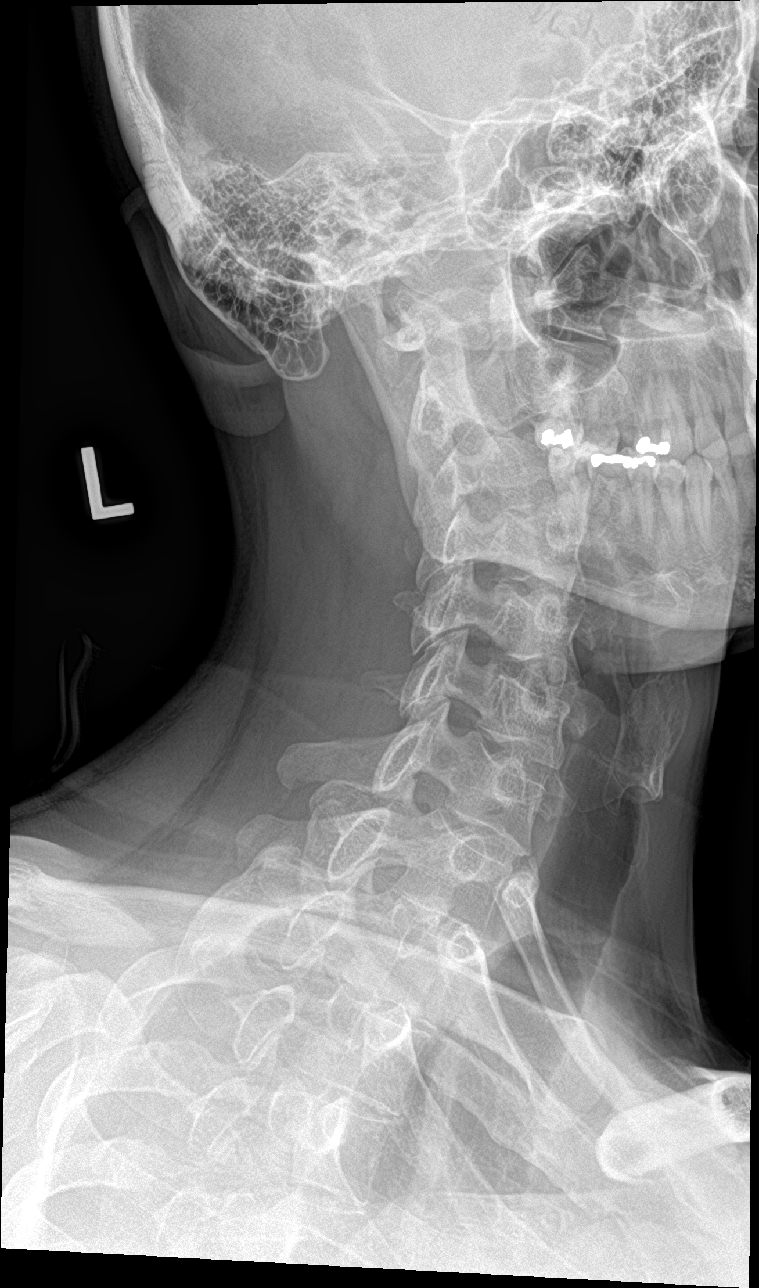

[c-spine obl (2 of 2)]
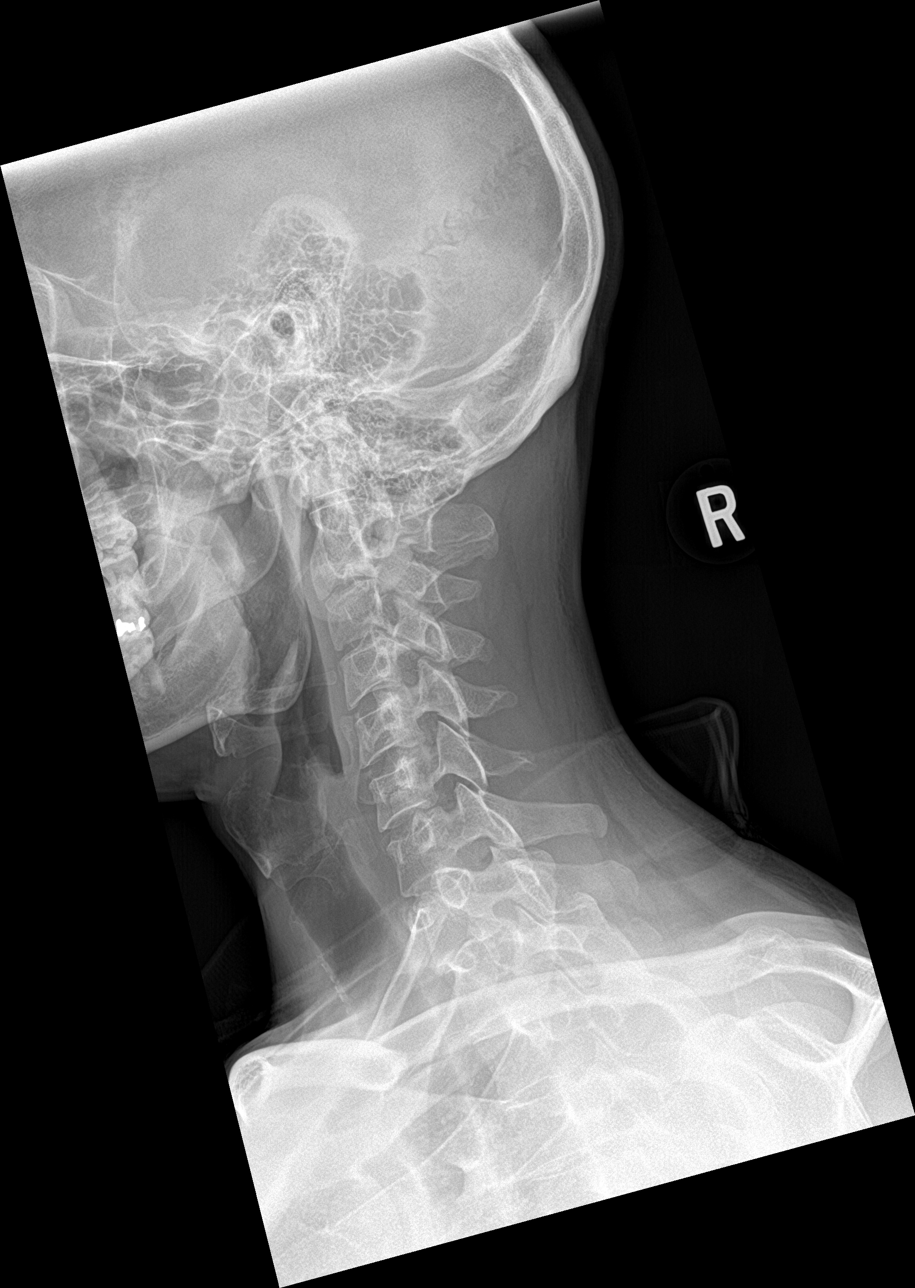

[c-spine ap]
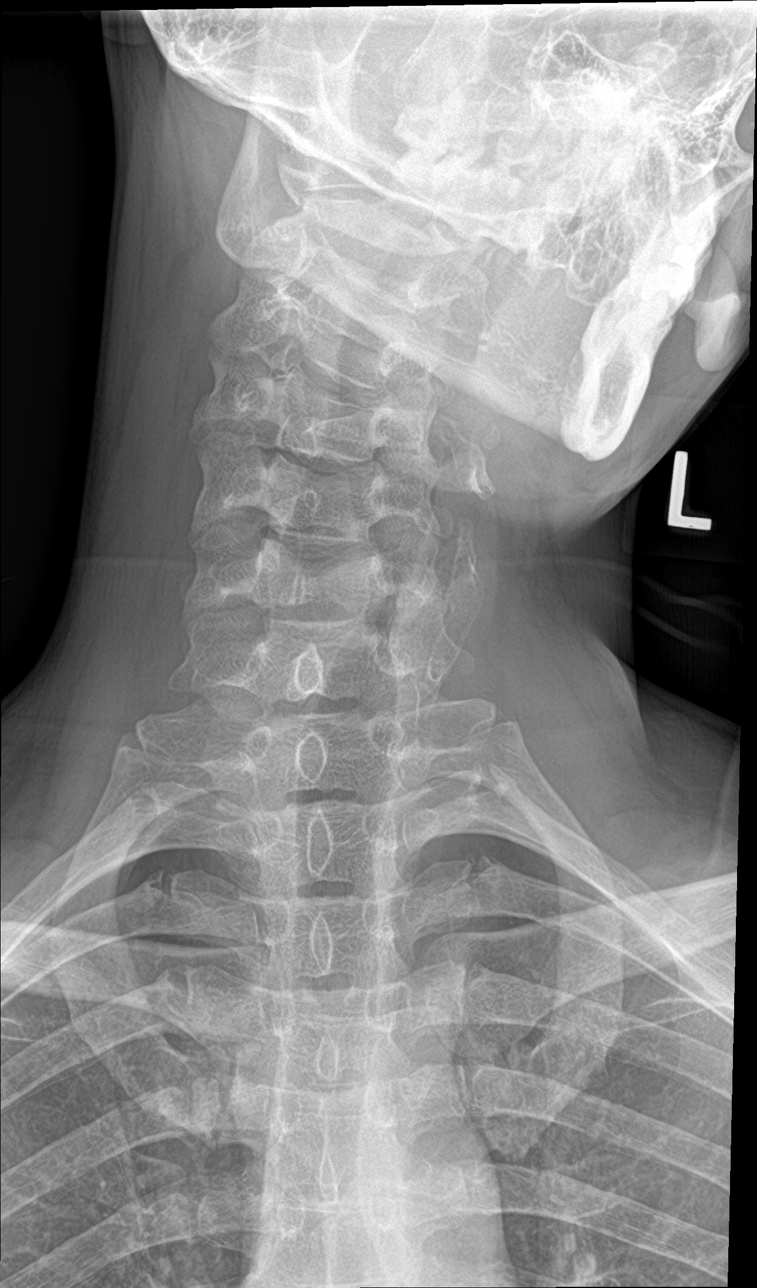

[4 of 4 positions shown; findings below may reference images not displayed]

FINDINGS: Mild dextroscoliosis of the cervical spine without underlying
vertebral anomaly. No perched facet. Vertebral body and disc height
maintained throughout. No prevertebral soft tissue swelling.
Negative for fracture. No definite osseous degenerative change.
Multiple dental restorations.
IMPRESSION: Torticollis without bone abnormality.

## 2019-05-11 ENCOUNTER — Other Ambulatory Visit: Payer: Self-pay

## 2019-05-11 DIAGNOSIS — Z20822 Contact with and (suspected) exposure to covid-19: Secondary | ICD-10-CM

## 2019-05-13 LAB — NOVEL CORONAVIRUS, NAA: SARS-CoV-2, NAA: NOT DETECTED

## 2019-05-16 ENCOUNTER — Telehealth: Payer: Self-pay | Admitting: *Deleted

## 2019-05-16 NOTE — Telephone Encounter (Signed)
Pt called back for results. Results reviewed with patient, negative; verbalizes understanding.

## 2020-05-31 ENCOUNTER — Other Ambulatory Visit: Payer: Self-pay

## 2020-05-31 ENCOUNTER — Encounter (HOSPITAL_BASED_OUTPATIENT_CLINIC_OR_DEPARTMENT_OTHER): Payer: Self-pay

## 2020-05-31 DIAGNOSIS — R6883 Chills (without fever): Secondary | ICD-10-CM | POA: Diagnosis not present

## 2020-05-31 DIAGNOSIS — F17200 Nicotine dependence, unspecified, uncomplicated: Secondary | ICD-10-CM | POA: Diagnosis not present

## 2020-05-31 DIAGNOSIS — T50905A Adverse effect of unspecified drugs, medicaments and biological substances, initial encounter: Secondary | ICD-10-CM | POA: Insufficient documentation

## 2020-05-31 DIAGNOSIS — J45909 Unspecified asthma, uncomplicated: Secondary | ICD-10-CM | POA: Insufficient documentation

## 2020-05-31 DIAGNOSIS — Z7951 Long term (current) use of inhaled steroids: Secondary | ICD-10-CM | POA: Insufficient documentation

## 2020-05-31 DIAGNOSIS — R61 Generalized hyperhidrosis: Secondary | ICD-10-CM | POA: Diagnosis not present

## 2020-05-31 LAB — RAPID URINE DRUG SCREEN, HOSP PERFORMED
Amphetamines: NOT DETECTED
Barbiturates: NOT DETECTED
Benzodiazepines: NOT DETECTED
Cocaine: POSITIVE — AB
Opiates: NOT DETECTED
Tetrahydrocannabinol: POSITIVE — AB

## 2020-05-31 NOTE — ED Triage Notes (Signed)
Pt c/o of perfuse sweating and chills that began this AM after med admin at [facility] (citalopram?), sent here to rule out withdrawal. No diaphoresis in triage, BP 138/100 in triage

## 2020-06-01 ENCOUNTER — Emergency Department (HOSPITAL_BASED_OUTPATIENT_CLINIC_OR_DEPARTMENT_OTHER)
Admission: EM | Admit: 2020-06-01 | Discharge: 2020-06-01 | Disposition: A | Discharge status: Home / Self Care | Payer: Medicaid Other | Attending: Emergency Medicine | Admitting: Emergency Medicine

## 2020-06-01 DIAGNOSIS — T50905A Adverse effect of unspecified drugs, medicaments and biological substances, initial encounter: Secondary | ICD-10-CM

## 2020-06-01 LAB — CBC WITH DIFFERENTIAL/PLATELET
Abs Immature Granulocytes: 0.02 10*3/uL (ref 0.00–0.07)
Basophils Absolute: 0.1 10*3/uL (ref 0.0–0.1)
Basophils Relative: 1 %
Eosinophils Absolute: 0.1 10*3/uL (ref 0.0–0.5)
Eosinophils Relative: 1 %
HCT: 45.3 % (ref 39.0–52.0)
Hemoglobin: 14.6 g/dL (ref 13.0–17.0)
Immature Granulocytes: 0 %
Lymphocytes Relative: 38 %
Lymphs Abs: 2.1 10*3/uL (ref 0.7–4.0)
MCH: 28.2 pg (ref 26.0–34.0)
MCHC: 32.2 g/dL (ref 30.0–36.0)
MCV: 87.6 fL (ref 80.0–100.0)
Monocytes Absolute: 0.5 10*3/uL (ref 0.1–1.0)
Monocytes Relative: 9 %
Neutro Abs: 2.7 10*3/uL (ref 1.7–7.7)
Neutrophils Relative %: 51 %
Platelets: 210 10*3/uL (ref 150–400)
RBC: 5.17 MIL/uL (ref 4.22–5.81)
RDW: 12.9 % (ref 11.5–15.5)
WBC: 5.4 10*3/uL (ref 4.0–10.5)
nRBC: 0 % (ref 0.0–0.2)

## 2020-06-01 LAB — BASIC METABOLIC PANEL
Anion gap: 8 (ref 5–15)
BUN: 10 mg/dL (ref 6–20)
CO2: 24 mmol/L (ref 22–32)
Calcium: 9 mg/dL (ref 8.9–10.3)
Chloride: 108 mmol/L (ref 98–111)
Creatinine, Ser: 0.81 mg/dL (ref 0.61–1.24)
GFR calc Af Amer: >60 mL/min (ref >60)
GFR calc non Af Amer: >60 mL/min (ref >60)
Glucose, Bld: 101 mg/dL — ABNORMAL HIGH (ref 70–99)
Potassium: 3.6 mmol/L (ref 3.5–5.1)
Sodium: 140 mmol/L (ref 135–145)

## 2020-06-01 LAB — CK: Total CK: 298 U/L (ref 49–397)

## 2020-06-01 NOTE — Discharge Instructions (Signed)
The patient may have had an adverse reaction to citalopram or to a combination of citalopram and duloxetine.  Serotonin syndrome cannot be ruled out.  Withdrawal symptoms seem unlikely as the patient admits to, and tests positive for, only cocaine and marijuana.  Diaphoresis and chills or not typical withdrawal symptoms of either of these drugs.

## 2020-06-01 NOTE — ED Provider Notes (Signed)
[place] [facility] Provider Note: Georgena Spurling, MD, FACEP  CSN: [id number] MRN: [id number] ARRIVAL: 05/31/20 at [place]: [id number]   CHIEF COMPLAINT  Sweating and Chills   HISTORY OF PRESENT ILLNESS  06/01/20 2:04 AM Kirk Strong is a 34 y.o. male who was sent from [facility] for profuse sweating and chills that began about 10 AM.  He states he was supposed to be switched from duloxetine to citalopram but he was given both of them today.  The symptoms began soon after taking the citalopram.  His symptoms were moderate to severe originally but have gradually eased and he is feeling much better now.  He states he still feels cold but is no longer sweating.  He was having generalized aches earlier which he rated as a 9 out of 10 and these have abated as well.  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  He has never had similar symptoms when stopping cocaine or marijuana in the past.   Past Medical History:  Diagnosis Date  . Asthma   . Fibromyalgia   . Rhabdomyoma     Past Surgical History:  Procedure Laterality Date  . EYE SURGERY      History reviewed. No pertinent family history.  Social History   Tobacco Use  . Smoking status: Current Every Day Smoker    Packs/day: 0.50  . Smokeless tobacco: Never Used  Vaping Use  . Vaping Use: Never used  Substance Use Topics  . Alcohol use: No  . Drug use: Yes    Types: Marijuana, Cocaine    Prior to Admission medications   Medication Sig Start Date End Date Taking? Authorizing Provider  albuterol (PROVENTIL HFA;VENTOLIN HFA) 108 (90 BASE) MCG/ACT inhaler Inhale 1-2 puffs into the lungs every 6 (six) hours as needed for wheezing or shortness of breath. 01/26/15   Linton Flemings, MD  traZODone (DESYREL) 100 MG tablet Take 100 mg by mouth at bedtime.    [provider]    Allergies Patient has no known allergies.   REVIEW OF SYSTEMS  Negative except as noted here or in  the History of Present Illness.   PHYSICAL EXAMINATION  Initial Vital Signs Blood pressure 128/90, pulse 70, temperature 98 F (36.7 C), temperature source Oral, resp. rate 20, height 5\' 7"  (1.702 m), weight 66.2 kg, SpO2 100 %.  Examination General: Well-developed, well-nourished male in no acute distress; appearance consistent with age of record HENT: normocephalic; atraumatic Eyes: Normal appearance Neck: supple Heart: regular rate and rhythm; no murmurs, rubs or gallops Lungs: clear to auscultation bilaterally Abdomen: soft; nondistended; nontender; bowel sounds present Extremities: No deformity; full range of motion; pulses normal Neurologic: Awake, alert and oriented; motor function intact in all extremities and symmetric; no facial droop Skin: Warm and dry Psychiatric: Normal mood and affect   RESULTS  Summary of this visit's results, reviewed and interpreted by myself:   EKG Interpretation  Date/Time:    Ventricular Rate:    PR Interval:    QRS Duration:   QT Interval:    QTC Calculation:   R Axis:     Text Interpretation:        Laboratory Studies: Results for orders placed or performed during the hospital encounter of 06/01/20 (from the past 24 hour(s))  Rapid urine drug screen (hospital performed)     Status: Abnormal   Collection Time: 05/31/20  7:15 PM  Result Value Ref Range   Opiates NONE  DETECTED NONE DETECTED   Cocaine POSITIVE (A) NONE DETECTED   Benzodiazepines NONE DETECTED NONE DETECTED   Amphetamines NONE DETECTED NONE DETECTED   Tetrahydrocannabinol POSITIVE (A) NONE DETECTED   Barbiturates NONE DETECTED NONE DETECTED  CBC with Differential/Platelet     Status: None   Collection Time: 06/01/20 12:49 AM  Result Value Ref Range   WBC 5.4 4.0 - 10.5 K/uL   RBC 5.17 4.22 - 5.81 MIL/uL   Hemoglobin 14.6 13.0 - 17.0 g/dL   HCT 45.3 39 - 52 %   MCV 87.6 80.0 - 100.0 fL   MCH 28.2 26.0 - 34.0 pg   MCHC 32.2 30.0 - 36.0 g/dL   RDW 12.9 11.5 -  15.5 %   Platelets 210 150 - 400 K/uL   nRBC 0.0 0.0 - 0.2 %   Neutrophils Relative % 51 %   Neutro Abs 2.7 1.7 - 7.7 K/uL   Lymphocytes Relative 38 %   Lymphs Abs 2.1 0.7 - 4.0 K/uL   Monocytes Relative 9 %   Monocytes Absolute 0.5 0 - 1 K/uL   Eosinophils Relative 1 %   Eosinophils Absolute 0.1 0 - 0 K/uL   Basophils Relative 1 %   Basophils Absolute 0.1 0 - 0 K/uL   Immature Granulocytes 0 %   Abs Immature Granulocytes 0.02 0.00 - 0.07 K/uL  Basic metabolic panel     Status: Abnormal   Collection Time: 06/01/20 12:49 AM  Result Value Ref Range   Sodium 140 135 - 145 mmol/L   Potassium 3.6 3.5 - 5.1 mmol/L   Chloride 108 98 - 111 mmol/L   CO2 24 22 - 32 mmol/L   Glucose, Bld 101 (H) 70 - 99 mg/dL   BUN 10 6 - 20 mg/dL   Creatinine, Ser 0.81 0.61 - 1.24 mg/dL   Calcium 9.0 8.9 - 10.3 mg/dL   GFR calc non Af Amer >60 >60 mL/min   GFR calc Af Amer >60 >60 mL/min   Anion gap 8 5 - 15  CK     Status: None   Collection Time: 06/01/20 12:49 AM  Result Value Ref Range   Total CK 298 49.0 - 397.0 U/L   Imaging Studies: No results found.  ED COURSE and MDM  Nursing notes, initial and subsequent vitals signs, including pulse oximetry, reviewed and interpreted by myself.  Vitals:   05/31/20 1911 05/31/20 1912 06/01/20 0155  BP: (!) 128/100  128/90  Pulse: 76  70  Resp: 20  20  Temp: 98 F (36.7 C)    TempSrc: Oral    SpO2: 100%  100%  Weight:  66.2 kg   Height:  5\' 7"  (1.702 m)    Medications - No data to display  This may represent an adverse reaction to citalopram or an interaction between citalopram or duloxetine (serotonin syndrome is a possibility although both drugs work in a similar manner).  He appears to be improving and does not need further evaluation or observation in the ED.  There is no evidence of rhabdomyolysis.  PROCEDURES  Procedures   ED DIAGNOSES     ICD-10-CM   1. Adverse drug effect, initial encounter  T50.905A        Jolinda Pinkstaff, Jenny Reichmann,  MD 06/01/20 0221

## 2020-06-01 NOTE — ED Notes (Signed)
[facility] called to come pick pt up. Pt ambulatory into the lobby to wait. No distress noted.

## 2022-05-23 ENCOUNTER — Emergency Department (HOSPITAL_COMMUNITY): Payer: Medicaid Other

## 2022-05-23 ENCOUNTER — Encounter (HOSPITAL_COMMUNITY): Payer: Self-pay

## 2022-05-23 ENCOUNTER — Emergency Department (HOSPITAL_COMMUNITY)
Admission: EM | Admit: 2022-05-23 | Discharge: 2022-05-23 | Discharge status: Against Medical Advice | Payer: Medicaid Other | Attending: Emergency Medicine | Admitting: Emergency Medicine

## 2022-05-23 DIAGNOSIS — S299XXA Unspecified injury of thorax, initial encounter: Secondary | ICD-10-CM | POA: Diagnosis not present

## 2022-05-23 DIAGNOSIS — W19XXXA Unspecified fall, initial encounter: Secondary | ICD-10-CM | POA: Diagnosis not present

## 2022-05-23 DIAGNOSIS — Z5321 Procedure and treatment not carried out due to patient leaving prior to being seen by health care provider: Secondary | ICD-10-CM | POA: Diagnosis not present

## 2022-05-23 DIAGNOSIS — Y9361 Activity, american tackle football: Secondary | ICD-10-CM | POA: Diagnosis not present

## 2022-05-23 NOTE — ED Triage Notes (Signed)
Pt arrived via EMS, from home, fell while playing football, states left sided rib pain since. Denies any SOB. Has been using OTC pain meds with no relief.

## 2023-12-08 ENCOUNTER — Ambulatory Visit (INDEPENDENT_AMBULATORY_CARE_PROVIDER_SITE_OTHER): Payer: MEDICAID | Admitting: Family Medicine

## 2023-12-08 VITALS — BP 108/72 | Ht 67.0 in | Wt 155.0 lb

## 2023-12-08 DIAGNOSIS — M5412 Radiculopathy, cervical region: Secondary | ICD-10-CM

## 2023-12-08 DIAGNOSIS — S76012A Strain of muscle, fascia and tendon of left hip, initial encounter: Secondary | ICD-10-CM | POA: Diagnosis not present

## 2023-12-08 MED ORDER — PREDNISONE 10 MG PO TABS: ORAL_TABLET | ORAL | 0 refills | Status: DC

## 2023-12-08 NOTE — Patient Instructions (Signed)
 We will go ahead with an MRI of your cervical spine. Your history, exam suggest a pinched nerve from here going into your arm. Take prednisone dose pack as directed x 6 days. You also have a muscle strain of your left hip including hip flexors. Add physical therapy for this to your treatment at [facility]. Heat 15 minutes at a time as needed.

## 2023-12-09 NOTE — Progress Notes (Signed)
 PCP: Clemencia Course, PA-C  Subjective:   HPI: Patient is a 38 y.o. male here for left arm, hip pain.  Patient reports he has remote history of clavicle fracture on the left. Did ok after this and fully healed. Then about 1 year ago started to get pain left side of posterior shoulder down his left arm including numbness/tingling into his hand. Tried gabapentin 300 tid without much benefit. Also reports left anterolateral hip pain for several months without injury. Had x-rays showing mild arthritis Pain worse to pressing on this area, with activity. Has been seen for this dating back to 07/2023 with Dr. Clelia Croft and also rheumatology.    Past Medical History:  Diagnosis Date   Asthma    Fibromyalgia    Rhabdomyoma     Current Outpatient Medications on File Prior to Visit  Medication Sig Dispense Refill   albuterol (PROVENTIL HFA;VENTOLIN HFA) 108 (90 BASE) MCG/ACT inhaler Inhale 1-2 puffs into the lungs every 6 (six) hours as needed for wheezing or shortness of breath. 1 Inhaler 0   traZODone (DESYREL) 100 MG tablet Take 100 mg by mouth at bedtime.     No current facility-administered medications on file prior to visit.    Past Surgical History:  Procedure Laterality Date   EYE SURGERY      No Known Allergies  BP 108/72   Ht 5\' 7"  (1.702 m)   Wt 155 lb (70.3 kg)   BMI 24.28 kg/m       No data to display              No data to display              Objective:  Physical Exam:  Gen: NAD, comfortable in exam room  Neck: No gross deformity, swelling, bruising. TTP Left trapezius.  No midline/bony TTP. FROM with pain on extension. BUE strength 5/5. Sensation intact to light touch.   Trace equal reflexes in triceps, biceps, brachioradialis tendons. NV intact distal BUEs.  Left shoulder: No swelling, ecchymoses.  No gross deformity. TTP as above.  No AC, biceps tenderness. FROM. Negative Crespin, Neers. Negative Yergasons. Strength 4/5 with  empty can and 5/5 resisted internal/external rotation.  Pain empty can and external rotation. NV intact distally.  Left hip: No deformity. Full range of motion with 5/5 strength. Tenderness to palpation over hip flexor.  No bony tenderness including greater trochanter. Neurovascularly intact distally. Negative logroll Negative faber, fadir, and piriformis stretches.   Assessment & Plan:  1. Cervical radiculopathy - with some evidence rotator cuff involvement.  Start prednisone dose pack, MRI of cervical spine given length of his symptoms and lack of improvement with treatment to date from PCP and rheumatology dating back to 07/2023.  Going to physical therapy for this without improvement as well.  2. Left hip pain - 2/2 strain of hip flexor.  Start physical therapy.  Heat.  Prednisone should help with this also.

## 2023-12-21 ENCOUNTER — Encounter: Payer: Self-pay | Admitting: Family Medicine

## 2023-12-24 ENCOUNTER — Ambulatory Visit
Admission: RE | Admit: 2023-12-24 | Discharge: 2023-12-24 | Disposition: A | Discharge status: Home / Self Care | Payer: MEDICAID | Source: Ambulatory Visit | Attending: Family Medicine | Admitting: Family Medicine

## 2023-12-24 DIAGNOSIS — M5412 Radiculopathy, cervical region: Secondary | ICD-10-CM

## 2024-01-06 ENCOUNTER — Ambulatory Visit (INDEPENDENT_AMBULATORY_CARE_PROVIDER_SITE_OTHER): Payer: MEDICAID | Admitting: Family Medicine

## 2024-01-06 VITALS — BP 124/74 | Ht 67.0 in | Wt 158.0 lb

## 2024-01-06 DIAGNOSIS — M79601 Pain in right arm: Secondary | ICD-10-CM | POA: Diagnosis not present

## 2024-01-06 DIAGNOSIS — M79602 Pain in left arm: Secondary | ICD-10-CM | POA: Diagnosis not present

## 2024-01-06 MED ORDER — PREDNISONE 10 MG PO TABS: ORAL_TABLET | ORAL | 0 refills | Status: DC

## 2024-01-06 NOTE — Patient Instructions (Signed)
 We will refer you to neurology for your left > right arm pain. Start prednisone dose pack and complete this x 6 days.

## 2024-01-07 ENCOUNTER — Encounter: Payer: Self-pay | Admitting: Family Medicine

## 2024-01-07 NOTE — Progress Notes (Signed)
 Patient returns to go over the MRI results of his cervical spine.  Of note the MRI was reassuring.  He has a slight disc bulge at C5-C6 but this is on the right side whereas most of his pain is on the left side in the left arm.  He still describes pain in the left side of the neck, posterior shoulder that radiates into the left arm all the way into the fingers with associated tingling at times.  He did not pick up the prednisone Dosepak but is interested in trying this.  Of note he has also been seeing rheumatology but the workup has been negative to date.  We discussed that his distribution still is most consistent with a neuropathic cause of his pain.  He does have some posterior shoulder joint tenderness but his rotator cuff exam today and overall shoulder exam is benign.  Will refer to neurology for evaluation and consideration of nerve conduction studies.  Total visit time including reviewing results, answering questions, documentation 10 minutes.

## 2024-01-18 ENCOUNTER — Encounter (HOSPITAL_COMMUNITY): Payer: Self-pay | Admitting: *Deleted

## 2024-01-18 ENCOUNTER — Emergency Department (HOSPITAL_COMMUNITY)
Admission: EM | Admit: 2024-01-18 | Discharge: 2024-01-19 | Disposition: A | Discharge status: Home / Self Care | Payer: MEDICAID | Attending: Emergency Medicine | Admitting: Emergency Medicine

## 2024-01-18 ENCOUNTER — Other Ambulatory Visit: Payer: Self-pay

## 2024-01-18 DIAGNOSIS — J45909 Unspecified asthma, uncomplicated: Secondary | ICD-10-CM | POA: Insufficient documentation

## 2024-01-18 DIAGNOSIS — R519 Headache, unspecified: Secondary | ICD-10-CM | POA: Insufficient documentation

## 2024-01-18 NOTE — ED Triage Notes (Addendum)
 2.5 weeks he has had headaches, comes and goes for about 20-30 minutes at a time. He says he had this similar about 6 months ago and it went away. Denies n/v. Endorses nasal congestion. Last took ibuprofen  around an hour ago. Not taken his BP meds in over a year. Last cocaine use about 2 days ago.

## 2024-01-19 ENCOUNTER — Emergency Department (HOSPITAL_COMMUNITY): Payer: MEDICAID

## 2024-01-19 MED ORDER — FEXOFENADINE HCL 60 MG PO TABS: 60.0000 mg | ORAL_TABLET | Freq: Two times a day (BID) | ORAL | 0 refills | Status: AC

## 2024-01-19 MED ORDER — ACETAMINOPHEN 500 MG PO TABS
1000.0000 mg | ORAL_TABLET | Freq: Once | ORAL | Status: AC
  Administered 2024-01-19: 1000 mg via ORAL
  Filled 2024-01-19: qty 2

## 2024-01-19 MED ORDER — LISINOPRIL 10 MG PO TABS: 10.0000 mg | ORAL_TABLET | Freq: Once | ORAL | Status: DC

## 2024-01-19 NOTE — ED Notes (Signed)
 Pt returned, said he was outside, will put him in a room

## 2024-01-19 NOTE — ED Provider Notes (Signed)
 [place] [facility] AT [facility] Provider Note  CSN: [id number] Arrival date & time: 01/18/24 2304  Chief Complaint(s) Headache  HPI Kirk Strong is a 38 y.o. male here with 2-1/2 weeks of intermittent right-sided headache.  Related to sinus congestion.  He denies any fevers or chills.  No coughing.  Pain has been intermittent during that time but constant today.  Patient has tried taking over-the-counter medicine without relief.  Patient endorsed marijuana use but to me denied any other illicit drug use such as cocaine.  However triage note reports cocaine use 2 days ago.  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 Authorizing Provider  fexofenadine  (ALLEGRA ) 60 MG tablet Take 1 tablet (60 mg total) by mouth 2 (two) times daily. 01/19/24  Yes Jerimyah Vandunk, Camila Cecil, MD  albuterol  (PROVENTIL  HFA;VENTOLIN  HFA) 108 (90 BASE) MCG/ACT inhaler Inhale 1-2 puffs into the lungs every 6 (six) hours as needed for wheezing or shortness of breath. 01/26/15   Adonis Alamin, MD  predniSONE  (DELTASONE ) 10 MG tablet 6 tabs po day 1, 5 tabs po day 2, 4 tabs po day 3, 3 tabs po day 4, 2 tabs po day 5, 1 tab po day 6 01/06/24   Hudnall, Fabienne Holter, MD  traZODone (DESYREL) 100 MG tablet Take 100 mg by mouth at bedtime.    [provider]                                                                                                                                    Allergies Patient has no known allergies.  Review of Systems Review of Systems As noted in HPI  Physical Exam Vital Signs  I have reviewed the triage vital signs BP (!) 137/107 (BP Location: Right Arm)   Pulse 72   Temp 98.5 F (36.9 C) (Oral)   Resp 18   SpO2  99%   Physical Exam Vitals reviewed.  Constitutional:      General: He is not in acute distress.    Appearance: He is well-developed. He is not diaphoretic.  HENT:     Head: Normocephalic and atraumatic.     Right Ear: External ear normal.     Left Ear: External ear normal.     Nose: Nose normal.     Mouth/Throat:     Mouth: Mucous membranes are moist.  Eyes:     General: No scleral icterus.    Conjunctiva/sclera: Conjunctivae normal.     Comments: Right eye strabismus  Neck:     Trachea: Phonation normal.  Cardiovascular:     Rate and Rhythm: Normal rate and regular rhythm.  Pulmonary:  Effort: Pulmonary effort is normal. No respiratory distress.     Breath sounds: No stridor.  Abdominal:     General: There is no distension.  Musculoskeletal:        General: Normal range of motion.     Cervical back: Normal range of motion.  Neurological:     Mental Status: He is alert and oriented to person, place, and time.     Sensory: Sensation is intact.     Motor: Motor function is intact.  Psychiatric:        Behavior: Behavior normal.     ED Results and Treatments Labs (all labs ordered are listed, but only abnormal results are displayed) Labs Reviewed - No data to display                                                                                                                       EKG  EKG Interpretation Date/Time:    Ventricular Rate:    PR Interval:    QRS Duration:    QT Interval:    QTC Calculation:   R Axis:      Text Interpretation:         Radiology CT Head Wo Contrast Result Date: 01/19/2024 CLINICAL DATA:  Headache, sudden and severe EXAM: CT HEAD WITHOUT CONTRAST TECHNIQUE: Contiguous axial images were obtained from the base of the skull through the vertex without intravenous contrast. RADIATION DOSE REDUCTION: This exam was performed according to the departmental dose-optimization program which includes automated exposure control, adjustment  of the mA and/or kV according to patient size and/or use of iterative reconstruction technique. COMPARISON:  07/21/2011 FINDINGS: Brain: No evidence of acute infarction, hemorrhage, hydrocephalus, extra-axial collection or mass lesion/mass effect. Vascular: No hyperdense vessel or unexpected calcification. Skull: Normal. Negative for fracture or focal lesion. Sinuses/Orbits: Partially covered right maxillary sinus shows mucosal thickening/secretions with significant degree of opacification. IMPRESSION: Focal right maxillary sinusitis. Normal appearance of the brain. Electronically Signed   By: Ronnette Coke M.D.   On: 01/19/2024 04:22    Medications Ordered in ED Medications  acetaminophen  (TYLENOL ) tablet 1,000 mg (1,000 mg Oral Given 01/19/24 0256)   Procedures Procedures  (including critical care time) Medical Decision Making / ED Course   Medical Decision Making Amount and/or Complexity of Data Reviewed Radiology: ordered.  Risk OTC drugs.   Typical headache for the patient. Non focal neuro exam.  BP elevated.  Patient was seen sitting seen in clinic in the last couple months and noted to have blood pressures in the low 100s.  Unlikely chronic hypertension.  Likely secondary to cocaine use. No fever. Doubt meningitis.  Doubt IIH. CT head w/o ICH. Right side sinusitis - possibly related to recent cocaine use vs allergic rhinitis.  Patient provided with Tylenol  which did provide significant relief.     Final Clinical Impression(s) / ED Diagnoses Final diagnoses:  Bad headache   The patient appears reasonably screened and/or stabilized for discharge and I doubt any other  medical condition or other [facility] requiring further screening, evaluation, or treatment in the ED at this time. I have discussed the findings, Dx and Tx plan with the patient/family who expressed understanding and agree(s) with the plan. Discharge instructions discussed at length. The patient/family was given  strict return precautions who verbalized understanding of the instructions. No further questions at time of discharge.  Disposition: Discharge  Condition: Good  ED Discharge Orders          Ordered    fexofenadine  (ALLEGRA ) 60 MG tablet  2 times daily        01/19/24 0436              Follow Up: Cranston Dk, MD [address] [place] [address] [phone]  Go to  as scheduled    This chart was dictated using voice recognition software.  Despite best efforts to proofread,  errors can occur which can change the documentation meaning.    Lindle Rhea, MD 01/19/24 [phone]

## 2024-01-21 ENCOUNTER — Encounter: Payer: Self-pay | Admitting: Neurology

## 2024-03-07 ENCOUNTER — Ambulatory Visit (INDEPENDENT_AMBULATORY_CARE_PROVIDER_SITE_OTHER): Payer: MEDICAID | Admitting: Neurology

## 2024-03-07 ENCOUNTER — Encounter: Payer: Self-pay | Admitting: Neurology

## 2024-03-07 VITALS — BP 132/82 | HR 70 | Ht 67.0 in | Wt 160.0 lb

## 2024-03-07 DIAGNOSIS — R202 Paresthesia of skin: Secondary | ICD-10-CM | POA: Diagnosis not present

## 2024-03-07 DIAGNOSIS — M79602 Pain in left arm: Secondary | ICD-10-CM | POA: Diagnosis not present

## 2024-03-07 DIAGNOSIS — M79601 Pain in right arm: Secondary | ICD-10-CM

## 2024-03-07 NOTE — Patient Instructions (Signed)
Nerve testing of the arms ° °ELECTROMYOGRAM AND NERVE CONDUCTION STUDIES (EMG/NCS) INSTRUCTIONS ° °How to Prepare °The neurologist conducting the EMG will need to know if you have certain medical conditions. Tell the neurologist and other EMG lab personnel if you: °Have a pacemaker or any other electrical medical device °Take blood-thinning medications °Have hemophilia, a blood-clotting disorder that causes prolonged bleeding °Bathing °Take a shower or bath shortly before your exam in order to remove oils from your skin. Don’t apply lotions or creams before the exam.  °What to Expect °You’ll likely be asked to change into a hospital gown for the procedure and lie down on an examination table. The following explanations can help you understand what will happen during the exam.  °Electrodes. The neurologist or a technician places surface electrodes at various locations on your skin depending on where you’re experiencing symptoms. Or the neurologist may insert needle electrodes at different sites depending on your symptoms.  °Sensations. The electrodes will at times transmit a tiny electrical current that you may feel as a twinge or spasm. The needle electrode may cause discomfort or pain that usually ends shortly after the needle is removed. °If you are concerned about discomfort or pain, you may want to talk to the neurologist about taking a short break during the exam.  °Instructions. During the needle EMG, the neurologist will assess whether there is any spontaneous electrical activity when the muscle is at rest - activity that isn’t present in healthy muscle tissue - and the degree of activity when you slightly contract the muscle.  °He or she will give you instructions on resting and contracting a muscle at appropriate times. Depending on what muscles and nerves the neurologist is examining, he or she may ask you to change positions during the exam.  °After your EMG °You may experience some temporary, minor  bruising where the needle electrode was inserted into your muscle. This bruising should fade within several days. If it persists, contact your primary care doctor.  ° °

## 2024-03-07 NOTE — Progress Notes (Signed)
 [facility] Clinic Note - Initial Visit   Date: 03/07/2024   Kirk Strong MRN: [id number] DOB: 12-07-1985   Dear Dr. Peggy Bowens:  Thank you for your kind referral of Kirk Strong for consultation of right arm pain. Although his history is well known to you, please allow us  to reiterate it for the purpose of our medical record. The patient was accompanied to the clinic by self.    Kirk Strong is a 38 y.o. right-handed male with fibromyalgia presenting for evaluation of right shoulder pain and paresthesias.   IMPRESSION/PLAN: Left shoulder pain and paresthesias.  Neurological exam shows diffuse myalgia with muscle testing, but no obvious weakness.  Reflexes and sensation is normal. MRI cervical spine does not show any structural pathology to explain his pain.  He had no benefit with PT.  Proceed with NCS/EMG of the left > right upper extremity.  Brachial plexopathy seems less likely given that pain has been ongoing for almost a year and he is not very symptomatic by paresthesias.   Further recommendations pending results.  ------------------------------------------------------------- History of present illness: Over the past year, he has been having left shoulder and upper arm pain.  Pain is sharp and achy.  It is worse with shoulder and arm movement.  He also has tingling in the left arm, which sometimes also occurs in the right arm.  He saw sports medicine who ordered MRI cervical spine which did not show compressive pathology.  He is referred for further evaluation.    He smokes half-pack per day.  He does not drink alcohol.  He is on disability.   Out-side paper records, electronic medical record, and images have been reviewed where available and summarized as:  MRI cervical spine wo contrast 12/31/2023: 1. At C5-C6, there is disc desiccation without significant disc height loss. Slight disc bulge without spinal canal stenosis. Uncovertebral  hypertrophy results in moderate right neural from narrowing. 2. No significant disc degeneration, disc herniation, spinal canal stenosis or neural foraminal narrowing at the remaining cervical levels.     CT head 01/19/2024: Focal right maxillary sinusitis. Normal appearance of the brain.     Past Medical History:  Diagnosis Date   Asthma    Fibromyalgia    Rhabdomyoma     Past Surgical History:  Procedure Laterality Date   EYE SURGERY       Medications:  Outpatient Encounter Medications as of 03/07/2024  Medication Sig   albuterol  (PROVENTIL  HFA;VENTOLIN  HFA) 108 (90 BASE) MCG/ACT inhaler Inhale 1-2 puffs into the lungs every 6 (six) hours as needed for wheezing or shortness of breath.   fexofenadine  (ALLEGRA ) 60 MG tablet Take 1 tablet (60 mg total) by mouth 2 (two) times daily.   oxyCODONE -acetaminophen  (PERCOCET/ROXICET) 5-325 MG tablet Take 1 tablet by mouth 2 (two) times daily as needed.   [DISCONTINUED] predniSONE  (DELTASONE ) 10 MG tablet 6 tabs po day 1, 5 tabs po day 2, 4 tabs po day 3, 3 tabs po day 4, 2 tabs po day 5, 1 tab po day 6   [DISCONTINUED] traZODone (DESYREL) 100 MG tablet Take 100 mg by mouth at bedtime.   No facility-administered encounter medications on file as of 03/07/2024.    Allergies:  Allergies  Allergen Reactions   Other Other (See Comments)    States he is allergic to a muscle relaxer, but he cannot recall which one    Family History: History reviewed. No pertinent family history.  Social History: Social History  Tobacco Use   Smoking status: Every Day    Current packs/day: 0.50    Types: Cigarettes   Smokeless tobacco: Never  Vaping Use   Vaping status: Never Used  Substance Use Topics   Alcohol use: Yes   Drug use: Yes    Types: Marijuana, Cocaine   Social History   Social History Narrative   Are you right handed or left handed? Right    Are you currently employed ? No    What is your current occupation?   Do you  live at home alone? no   Who lives with you? Family    What type of home do you live in: 1 story or 2 story?  2 story        Vital Signs:  BP 132/82   Pulse 70   Ht 5\' 7"  (1.702 m)   Wt 160 lb (72.6 kg)   SpO2 99%   BMI 25.06 kg/m   Neurological Exam: MENTAL STATUS including orientation to time, place, person, recent and remote memory, attention span and concentration, language, and fund of knowledge is normal.  Speech is not dysarthric.  CRANIAL NERVES: II:  No visual field defects.    III-IV-VI: Pupils equal round and reactive to light.  Right eye exotropia causing disconjugate gaze.  Right eye does not turn inwards, otherwise extra-ocular eye movements in all directions of gaze.  No nystagmus.  No ptosis.   V:  Normal facial sensation.    VII:  Normal facial symmetry and movements.   VIII:  Normal hearing and vestibular function.   IX-X:  Normal palatal movement.   XI:  Normal shoulder shrug and head rotation.   XII:  Normal tongue strength and range of motion, no deviation or fasciculation.  MOTOR:  No atrophy, fasciculations or abnormal movements.  No pronator drift.  Diffuse tenderness with muscle testing Upper Extremity:  Right  Left  Deltoid  5/5   5/5   Biceps  5/5   5/5   Triceps  5/5   5/5   Wrist extensors  5/5   5/5   Wrist flexors  5/5   5/5   Finger extensors  5/5   5/5   Finger flexors  5/5   5/5   Dorsal interossei  5/5   5/5   Abductor pollicis  5/5   5/5   Tone (Ashworth scale)  0  0   Lower Extremity:  Right  Left  Hip flexors  5/5   5/5   Knee flexors  5/5   5/5   Knee extensors  5/5   5/5   Dorsiflexors  5/5   5/5   Plantarflexors  5/5   5/5   Toe extensors  5/5   5/5   Toe flexors  5/5   5/5   Tone (Ashworth scale)  0  0   MSRs:                                           Right        Left brachioradialis 2+  2+  biceps 2+  2+  triceps 2+  2+  patellar 2+  2+  ankle jerk 2+  2+  plantar response down  down   SENSORY:  Normal and  symmetric perception of light touch, pinprick, vibration, and temperature.   COORDINATION/GAIT: Normal finger-to- nose-finger.  Intact  rapid alternating movements bilaterally.  Gait narrow based and stable. Tandem and stressed gait intact.     Thank you for allowing me to participate in patient's care.  If I can answer any additional questions, I would be pleased to do so.    Sincerely,    Suki Crockett K. Lydia Sams, DO

## 2024-04-14 ENCOUNTER — Encounter: Payer: MEDICAID | Admitting: Neurology

## 2024-04-14 ENCOUNTER — Ambulatory Visit: Payer: MEDICAID | Admitting: Neurology

## 2024-04-14 ENCOUNTER — Ambulatory Visit: Payer: Self-pay | Admitting: Neurology

## 2024-04-14 DIAGNOSIS — M79601 Pain in right arm: Secondary | ICD-10-CM | POA: Diagnosis not present

## 2024-04-14 DIAGNOSIS — M79602 Pain in left arm: Secondary | ICD-10-CM

## 2024-04-14 DIAGNOSIS — R202 Paresthesia of skin: Secondary | ICD-10-CM

## 2024-04-14 NOTE — Procedures (Signed)
 [facility]  [address], [address]  [place], [place] [address] Tel: [phone] Fax: [phone] Test Date:  04/14/2024  Patient: Kirk Strong DOB: 16-Feb-1986 Physician: Tonita Blanch, DO  Sex: Male Height: 5' 7 Ref Phys: Tonita Blanch, DO  ID#: [id number]   Technician:    History: This is a 38 year old man referred for evaluation of left arm pain and weakness.  NCV & EMG Findings: Extensive electrodiagnostic testing of the left upper extremity and additional studies of the right shows: Left median, ulnar, radial, medial antebrachial cutaneous, and lateral antebrachial cutaneous sensory responses are within normal limits. Left median and ulnar motor responses are within normal limits. There is no evidence of active or chronic motor axonal loss changes affecting any of the tested muscles.  Motor unit configuration and recruitment pattern is within normal limits.   Impression: This is a normal study of the left upper extremity.  In particular, there is no evidence of a cervical radiculopathy, brachial plexopathy, diffuse myopathy, or sensorimotor polyneuropathy.   ___________________________ Tonita Blanch, DO    Nerve Conduction Studies   Stim Site NR Peak (ms) Norm Peak (ms) O-P Amp (V) Norm O-P Amp  Left Lat Ante Brach Cutan Anti Sensory (Lat Forearm)  32 C  Lat Biceps    1.6 <2.9 42.4 >14  Right Lat Ante Brach Cutan Anti Sensory (Lat Forearm)  32 C  Lat Biceps    1.5 <2.9 42.9 >14  Left Med Ante Brach Cutan Anti Sensory (Med Forearm)  32 C  Elbow    1.9  10.0   Right Med Ante Brach Cutan Anti Sensory (Med Forearm)  32 C  Elbow    2.2  6.1   Left Median Anti Sensory (2nd Digit)  32 C  Wrist    2.8 <3.4 28.0 >20  Left Radial Anti Sensory (Base 1st Digit)  32 C  Wrist    2.2 <2.7 25.1 >18  Left Ulnar Anti Sensory (5th Digit)  32 C  Wrist    2.7 <3.1 26.2 >12     Stim Site NR Onset (ms) Norm Onset (ms) O-P Amp (mV) Norm O-P Amp Site1 Site2  Delta-0 (ms) Dist (cm) Vel (m/s) Norm Vel (m/s)  Left Median Motor (Abd Poll Brev)  32 C  Wrist    2.6 <3.9 9.3 >6 Elbow Wrist 4.5 28.0 62 >50  Elbow    7.1  8.9         Left Ulnar Motor (Abd Dig Minimi)  32 C  Wrist    2.0 <3.1 9.4 >7 B Elbow Wrist 3.5 22.0 63 >50  B Elbow    5.5  8.9  A Elbow B Elbow 1.5 10.0 67 >50  A Elbow    7.0  8.7          Electromyography   Side Muscle Ins.Act Fibs Fasc Recrt Amp Dur Poly Activation Comment  Left 1stDorInt Nml Nml Nml Nml Nml Nml Nml Nml N/A  Left Abd Poll Brev Nml Nml Nml Nml Nml Nml Nml Nml N/A  Left ABD Dig Min Nml Nml Nml Nml Nml Nml Nml Nml N/A  Left FlexPolLong Nml Nml Nml Nml Nml Nml Nml Nml N/A  Left Ext Indicis Nml Nml Nml Nml Nml Nml Nml Nml N/A  Left PronatorTeres Nml Nml Nml Nml Nml Nml Nml Nml N/A  Left Biceps Nml Nml Nml Nml Nml Nml Nml Nml N/A  Left Triceps Nml Nml Nml Nml Nml Nml Nml Nml N/A  Left  Deltoid Nml Nml Nml Nml Nml Nml Nml Nml N/A  Left Infraspinatus Nml Nml Nml Nml Nml Nml Nml Nml N/A      Waveforms:

## 2024-06-12 ENCOUNTER — Ambulatory Visit: Payer: MEDICAID | Admitting: Family Medicine

## 2024-06-12 ENCOUNTER — Ambulatory Visit (INDEPENDENT_AMBULATORY_CARE_PROVIDER_SITE_OTHER): Payer: MEDICAID | Admitting: Family Medicine

## 2024-06-12 VITALS — BP 124/86 | Ht 67.0 in | Wt 160.0 lb

## 2024-06-12 DIAGNOSIS — M722 Plantar fascial fibromatosis: Secondary | ICD-10-CM | POA: Diagnosis not present

## 2024-06-12 NOTE — Progress Notes (Cosign Needed)
 PCP: Loreli Elyn SAILOR, MD  Subjective:   HPI: Patient is a 38 y.o. male here for bilateral foot pain, worse on the right x1.5 months. States it started around his achilles and now is mainly on the bottom of his foot. Stands a lot, wears slides. Has not tried anything for the pain. No injury.   Past Medical History:  Diagnosis Date   Asthma    Fibromyalgia    Rhabdomyoma     Current Outpatient Medications on File Prior to Visit  Medication Sig Dispense Refill   albuterol  (PROVENTIL  HFA;VENTOLIN  HFA) 108 (90 BASE) MCG/ACT inhaler Inhale 1-2 puffs into the lungs every 6 (six) hours as needed for wheezing or shortness of breath. 1 Inhaler 0   fexofenadine  (ALLEGRA ) 60 MG tablet Take 1 tablet (60 mg total) by mouth 2 (two) times daily. 60 tablet 0   oxyCODONE -acetaminophen  (PERCOCET/ROXICET) 5-325 MG tablet Take 1 tablet by mouth 2 (two) times daily as needed.     No current facility-administered medications on file prior to visit.    Past Surgical History:  Procedure Laterality Date   EYE SURGERY      Allergies  Allergen Reactions   Other Other (See Comments)    States he is allergic to a muscle relaxer, but he cannot recall which one    BP 124/86   Ht 5' 7 (1.702 m)   Wt 160 lb (72.6 kg)   BMI 25.06 kg/m       No data to display              No data to display              Objective:  Physical Exam:  Gen: NAD, comfortable in exam room Foot: TTP to bilateral feet along medial longitudinal arch. TTP over achilles tendon on the R. Tight achilles tendons bilaterally with dorsiflexion. Ambulates independently without difficulty.    Assessment & Plan:  1. 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

## 2024-06-12 NOTE — Patient Instructions (Signed)
 You have plantar fasciitis Take tylenol  and/or aleve  as needed for pain  Plantar fascia stretch for 20-30 seconds (do 3 of these) in morning Lowering/raise on a step exercises 3 x 10 once or twice a day - this is very important for long term recovery. Can add heel walks, toe walks forward and backward as well Ice heel for 15 minutes as needed. Avoid flat shoes/barefoot walking as much as possible. Arch straps have been shown to help with pain. Inserts are important - use the sports insoles with scaphoid pads.  If this feels like too much arch support you can remove the pads. Physical therapy is also an option. Follow up with me in 1 month.

## 2024-06-13 ENCOUNTER — Encounter: Payer: Self-pay | Admitting: Family Medicine
